# Patient Record
Sex: Male | Born: 1985 | Race: Black or African American | Hispanic: No | Marital: Single | Smoking: Current some day smoker
Health system: Southern US, Community
[De-identification: ages and names within clinical notes are randomized; demographics above are authoritative.]

## PROBLEM LIST (undated history)

## (undated) DIAGNOSIS — J45909 Unspecified asthma, uncomplicated: Secondary | ICD-10-CM

## (undated) DIAGNOSIS — I10 Essential (primary) hypertension: Secondary | ICD-10-CM

---
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
## 2008-05-16 ENCOUNTER — Emergency Department: Payer: Self-pay | Admitting: Emergency Medicine

## 2012-12-28 ENCOUNTER — Emergency Department: Payer: Self-pay | Admitting: Emergency Medicine

## 2016-12-28 ENCOUNTER — Encounter: Payer: Self-pay | Admitting: Emergency Medicine

## 2016-12-28 ENCOUNTER — Emergency Department
Admission: EM | Admit: 2016-12-28 | Discharge: 2016-12-28 | Disposition: A | Discharge status: Home / Self Care | Payer: Self-pay | Attending: Emergency Medicine | Admitting: Emergency Medicine

## 2016-12-28 DIAGNOSIS — F17200 Nicotine dependence, unspecified, uncomplicated: Secondary | ICD-10-CM | POA: Insufficient documentation

## 2016-12-28 DIAGNOSIS — R030 Elevated blood-pressure reading, without diagnosis of hypertension: Secondary | ICD-10-CM | POA: Insufficient documentation

## 2016-12-28 DIAGNOSIS — J301 Allergic rhinitis due to pollen: Secondary | ICD-10-CM | POA: Insufficient documentation

## 2016-12-28 MED ORDER — FLUTICASONE PROPIONATE 50 MCG/ACT NA SUSP: 2.0000 | Freq: Every day | NASAL | 0 refills | Status: DC

## 2016-12-28 MED ORDER — FEXOFENADINE HCL 180 MG PO TABS: 180.0000 mg | ORAL_TABLET | Freq: Every day | ORAL | 0 refills | Status: DC

## 2016-12-28 NOTE — ED Triage Notes (Signed)
Sinus congestion x 3 weeks. Sent home from work to be seen. Needs note he was seen and when he can return to work at discharge.

## 2016-12-28 NOTE — ED Notes (Signed)
NAD noted at time of D/C. Pt denies questions or concerns. Pt ambulatory to the lobby at this time.  

## 2016-12-28 NOTE — Discharge Instructions (Signed)
Follow up with [facility] or your Primary Care Provider for a re-check of your blood pressure.  Begin taking Allegra and flonase for nasal congestion.  Increase fluids.  Decrease smoking.  Tylenol if needed for pain or headache.   May also consider going to clinics such as [facility], [facility], [facility], or [facility].

## 2016-12-28 NOTE — ED Provider Notes (Signed)
[facility] Emergency Department Provider Note   ____________________________________________   First MD Initiated Contact with Patient 12/28/16 0827     (approximate)  I have reviewed the triage vital signs and the nursing notes.   HISTORY  Chief Complaint Facial Pain    HPI Arthur Barnes is a 31 y.o. male presents to emergency room with complaint of sinus congestion for 3 weeks. Patient states that he has had nasal congestion and a slightly productive cough for the last 3 weeks. He is taken over-the-counter medication with some relief. He has not taken any medication today. He states that work sent him here for evaluation. He is unaware of any fever or chills. There's been no nausea or vomiting. He continues to have a mostly nonproductive cough. Patient admits to smoking daily. Blood pressure is also elevated patient denies any history of hypertension for himself but states there is a family history. Currently he rates his discomfort as a 6/10.   History reviewed. No pertinent past medical history.  There are no active problems to display for this patient.   History reviewed. No pertinent surgical history.  Prior to Admission medications   Medication Sig Start Date End Date Taking? Authorizing Provider  fexofenadine (ALLEGRA) 180 MG tablet Take 1 tablet (180 mg total) by mouth daily. 12/28/16   Tommi Rumps, PA-C  fluticasone (FLONASE) 50 MCG/ACT nasal spray Place 2 sprays into both nostrils daily. 12/28/16 12/28/17  Tommi Rumps, PA-C    Allergies Patient has no known allergies.  No family history on file.  Social History Social History  Substance Use Topics  . Smoking status: Current Some Day Smoker  . Smokeless tobacco: Not on file  . Alcohol use Not on file    Review of Systems Constitutional: No fever/chills Eyes: No visual changes. ENT: Positive nasal congestion. Cardiovascular: Denies chest pain. Respiratory: Denies  shortness of breath. Positive cough. Gastrointestinal: No abdominal pain.  No nausea, no vomiting.  No diarrhea. Musculoskeletal: Negative for back pain. Neurological: Negative for headaches, focal weakness or numbness.  10-point ROS otherwise negative.  ____________________________________________   PHYSICAL EXAM:  VITAL SIGNS: ED Triage Vitals  Enc Vitals Group     BP 12/28/16 0730 (!) 146/96     Pulse Rate 12/28/16 0730 100     Resp 12/28/16 0730 20     Temp 12/28/16 0730 97.9 F (36.6 C)     Temp Source 12/28/16 0730 Oral     SpO2 12/28/16 0730 95 %     Weight 12/28/16 0732 220 lb (99.8 kg)     Height 12/28/16 0732 5\' 4"  (1.626 m)     Head Circumference --      Peak Flow --      Pain Score 12/28/16 0729 6     Pain Loc --      Pain Edu? --      Excl. in GC? --     Constitutional: Alert and oriented. Well appearing and in no acute distress.Initially on entering the room patient is sound asleep and snoring. He is easily awakened. Eyes: Conjunctivae are normal. PERRL. EOMI. Head: Atraumatic. Nose: Moderate congestion/rhinnorhea. Turbinates are pale and boggy. EACs and TMs clear bilaterally. Mouth/Throat: Mucous membranes are moist.  Oropharynx non-erythematous. Moderate posterior drainage. Neck: No stridor.   Hematological/Lymphatic/Immunilogical: No cervical lymphadenopathy. Cardiovascular: Normal rate, regular rhythm. Grossly normal heart sounds.  Good peripheral circulation. Respiratory: Normal respiratory effort.  No retractions. Lungs CTAB. Musculoskeletal: No lower extremity tenderness nor  edema.  No joint effusions. Neurologic:  Normal speech and language. No gross focal neurologic deficits are appreciated. No gait instability. Skin:  Skin is warm, dry and intact. No rash noted. Psychiatric: Mood and affect are normal. Speech and behavior are normal.  ____________________________________________   LABS (all labs ordered are listed, but only abnormal results are  displayed)  Labs Reviewed - No data to display  PROCEDURES  Procedure(s) performed: None  Procedures  Critical Care performed: No  ____________________________________________   INITIAL IMPRESSION / ASSESSMENT AND PLAN / ED COURSE  Pertinent labs & imaging results that were available during my care of the patient were reviewed by me and considered in my medical decision making (see chart for details).  Patient's blood pressure was checked second time and still was elevated. He is encouraged to go to his primary care doctor or the doctor that his mother goes to for recheck of his blood pressure. Patient is unsure who his primary doctor is but states his mother knows. Patient was encouraged to discontinue smoking. He was started on Allegra 1 daily for allergies and Flonase nasal spray. He is to increase fluids and discontinue smoking if at all possible.    ____________________________________________   FINAL CLINICAL IMPRESSION(S) / ED DIAGNOSES  Final diagnoses:  Acute seasonal allergic rhinitis due to pollen  Blood pressure elevated without history of HTN      NEW MEDICATIONS STARTED DURING THIS VISIT:  Discharge Medication List as of 12/28/2016  7:58 AM    START taking these medications   Details  fexofenadine (ALLEGRA) 180 MG tablet Take 1 tablet (180 mg total) by mouth daily., Starting Sat 12/28/2016, Print    fluticasone (FLONASE) 50 MCG/ACT nasal spray Place 2 sprays into both nostrils daily., Starting Sat 12/28/2016, Until Sun 12/28/2017, Print         Note:  This document was prepared using Dragon voice recognition software and may include unintentional dictation errors.    Tommi Rumps, PA-C 12/28/16 4497    Nita Sickle, MD 12/30/16 [phone]

## 2017-06-01 ENCOUNTER — Encounter: Payer: Self-pay | Admitting: *Deleted

## 2017-06-01 ENCOUNTER — Emergency Department
Admission: EM | Admit: 2017-06-01 | Discharge: 2017-06-01 | Disposition: A | Discharge status: Home / Self Care | Payer: Self-pay | Attending: Emergency Medicine | Admitting: Emergency Medicine

## 2017-06-01 DIAGNOSIS — M545 Low back pain, unspecified: Secondary | ICD-10-CM

## 2017-06-01 DIAGNOSIS — I10 Essential (primary) hypertension: Secondary | ICD-10-CM | POA: Insufficient documentation

## 2017-06-01 DIAGNOSIS — F17210 Nicotine dependence, cigarettes, uncomplicated: Secondary | ICD-10-CM | POA: Insufficient documentation

## 2017-06-01 HISTORY — DX: Essential (primary) hypertension: I10

## 2017-06-01 MED ORDER — IBUPROFEN 400 MG PO TABS
600.0000 mg | ORAL_TABLET | ORAL | Status: AC
  Administered 2017-06-01: 600 mg via ORAL
  Filled 2017-06-01: qty 2

## 2017-06-01 MED ORDER — DEXAMETHASONE 4 MG PO TABS
6.0000 mg | ORAL_TABLET | Freq: Once | ORAL | Status: AC
  Administered 2017-06-01: 6 mg via ORAL
  Filled 2017-06-01: qty 1.5

## 2017-06-01 MED ORDER — HYDROCODONE-ACETAMINOPHEN 5-325 MG PO TABS: 1.0000 | ORAL_TABLET | Freq: Four times a day (QID) | ORAL | 0 refills | Status: DC | PRN

## 2017-06-01 MED ORDER — IBUPROFEN 600 MG PO TABS
600.0000 mg | ORAL_TABLET | Freq: Four times a day (QID) | ORAL | 0 refills | Status: DC | PRN
Start: 2017-06-01 — End: 2018-08-01

## 2017-06-01 MED ORDER — HYDROCODONE-ACETAMINOPHEN 5-325 MG PO TABS
1.0000 | ORAL_TABLET | Freq: Once | ORAL | Status: AC
Start: 2017-06-01 — End: 2017-06-01
  Administered 2017-06-01: 1 via ORAL
  Filled 2017-06-01: qty 1

## 2017-06-01 NOTE — ED Notes (Signed)
ED Provider at bedside. 

## 2017-06-01 NOTE — ED Provider Notes (Signed)
[facility] Emergency Department Provider Note   ____________________________________________   First MD Initiated Contact with Patient 06/01/17 1723     (approximate)  I have reviewed the triage vital signs and the nursing notes.   HISTORY  Chief Complaint Back Pain    HPI Arthur Barnes is a 31 y.o. male here for evaluation of left back pain  Patient reports for about one week  significant pain in his left lower backstarted while at work. 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 He is been experiencing pain, its just to the left of the spine and worsen when he tries to move or twist or bend. No numbness or tingling. The pain is not moved. No weakness. Denies any ripping or tearing sensations. No chest pain or trouble breathing. No abdominal pain. No trouble urinating.  No incontinence. No numbness anywhere. The pain does not radiate,  Reports he thinks he may have pulled a muscle in his back. He tried taking some ibuprofen for the last few days with little relief. He also reports he's been told he has high blood pressure in the past, that he may need some follow-up on this as well   Past Medical History:  Diagnosis Date  . Hypertension     There are no active problems to display for this patient.   History reviewed. No pertinent surgical history.  Prior to Admission medications   Medication Sig Start Date End Date Taking? Authorizing Provider  HYDROcodone-acetaminophen (NORCO/VICODIN) 5-325 MG tablet Take 1 tablet by mouth every 6 (six) hours as needed for moderate pain. 06/01/17   Sharyn Creamer, MD  ibuprofen (ADVIL,MOTRIN) 600 MG tablet Take 1 tablet (600 mg total) by mouth every 6 (six) hours as needed. 06/01/17   Sharyn Creamer, MD    Allergies Patient has no known allergies.  History reviewed. No pertinent family history.  Social History Social History  Substance Use Topics  . Smoking status:  Current Some Day Smoker  . Smokeless tobacco: Not on file  . Alcohol use Not on file    Review of Systems Constitutional: No fever/chills Eyes: No visual changes. ENT: No sore throat. Cardiovascular: Denies chest pain. Respiratory: Denies shortness of breath. Gastrointestinal: No abdominal pain.  No nausea, no vomiting.   Genitourinary: Negative for dysuria. Musculoskeletal: see history of present illness Skin: Negative for rash. Neurological: Negative for headaches, focal weakness or numbness.    ____________________________________________   PHYSICAL EXAM:  VITAL SIGNS: ED Triage Vitals  Enc Vitals Group     BP 06/01/17 1702 (!) 150/102     Pulse Rate 06/01/17 1702 97     Resp 06/01/17 1702 16     Temp 06/01/17 1702 98.2 F (36.8 C)     Temp Source 06/01/17 1702 Oral     SpO2 06/01/17 1702 95 %     Weight 06/01/17 1701 230 lb (104.3 kg)     Height 06/01/17 1701 5\' 3"  (1.6 m)     Head Circumference --      Peak Flow --      Pain Score 06/01/17 1700 9     Pain Loc --      Pain Edu? --      Excl. in GC? --     Constitutional: Alert and oriented. Well appearing and in no acute distress.he is sitting upright on the bed, very pleasant but obviously experiencing pain in his back when he tries to move  or bend Eyes: Conjunctivae are normal. Head: Atraumatic. Nose: No congestion/rhinnorhea. Mouth/Throat: Mucous membranes are moist. Neck: No stridor.   Cardiovascular: Normal rate, regular rhythm. Grossly normal heart sounds.  Good peripheral circulation. Respiratory: Normal respiratory effort.  No retractions. Lungs CTAB. Gastrointestinal: Soft and nontender. No distention.no abdominal bruit. No palpable mass. He denies any midline pain in the back or abdomen. Musculoskeletal: No lower extremity tenderness nor edema.normal strength in lower extremities bilateral. Normal gait. Dorsalis pedis and posterior tibial pulses are normal in lower extremities bilateral. Normal  sensation in the feet and legs bilaterally. Normal reflexes lower extremity bilateral. No decreased sensation along the posterior buttock Neurologic:  Normal speech and language. No gross focal neurologic deficits are appreciated.  Skin:  Skin is warm, dry and intact. No rash noted. Psychiatric: Mood and affect are normal. Speech and behavior are normal.  ____________________________________________   LABS (all labs ordered are listed, but only abnormal results are displayed)  Labs Reviewed - No data to display ____________________________________________  EKG   ____________________________________________  RADIOLOGY  no back pain and red flags. No indication for emergent imaging ____________________________________________   PROCEDURES  Procedure(s) performed: None  Procedures  Critical Care performed: No  ____________________________________________   INITIAL IMPRESSION / ASSESSMENT AND PLAN / ED COURSE  Pertinent labs & imaging results that were available during my care of the patient were reviewed by me and considered in my medical decision making (see chart for details).  back pain. Appears likely musculoskeletal seated in the left mid to upper lumbar paraspinous region. Symptoms are abated by resting, worsened by movement. Likely associated with lifting and moving at work when it initially happened. Suspect likely of lumbar or musculoskeletal strain involving the mid to lower back. No sciatica or signs or symptoms of cauda equina.  His blood pressure is elevated, but also notes he is in pain. Discussed with him and he will  set up follow-up with the primary for further evaluation. I am hesitant to initiate blood pressure medications today, especially given that he is in pain.  no cardiac or pulmonary symptoms. No vascular symptoms. Denies any abdominal pain. No symptoms to suggest dissection. Reports pain in the same spot for several days worse with bending.  I  will prescribe the patient a narcotic pain medicine due to their condition which I anticipate will cause at least moderate pain short term. I discussed with the patient safe use of narcotic pain medicines, and that they are not to drive, work in dangerous areas, or ever take more than prescribed (no more than 1 pill every 6 hours). We discussed that this is the type of medication that can be  overdosed on and the risks of this type of medicine. Patient is very agreeable to only use as prescribed and to never use more than prescribed.       ____________________________________________   FINAL CLINICAL IMPRESSION(S) / ED DIAGNOSES  Final diagnoses:  Acute left-sided low back pain without sciatica      NEW MEDICATIONS STARTED DURING THIS VISIT:  Discharge Medication List as of 06/01/2017  5:32 PM    START taking these medications   Details  HYDROcodone-acetaminophen (NORCO/VICODIN) 5-325 MG tablet Take 1 tablet by mouth every 6 (six) hours as needed for moderate pain., Starting Sun 06/01/2017, Print    ibuprofen (ADVIL,MOTRIN) 600 MG tablet Take 1 tablet (600 mg total) by mouth every 6 (six) hours as needed., Starting Sun 06/01/2017, Print         Note:  This document was prepared using Dragon voice recognition software and may include unintentional dictation errors.     Sharyn Creamer, MD 06/01/17 1806

## 2017-06-01 NOTE — Discharge Instructions (Signed)
You have been seen in the Emergency Department (ED)  today for back pain.  Your workup and exam have not shown any acute abnormalities and you are likely suffering from muscle strain or possible problems with your discs, but there is no treatment that will fix your symptoms at this time.  Please take Motrin (ibuprofen) as needed for your pain according to the instructions written on the box.  Alternatively, for the next five days you can take 600mg three times daily with meals (it may upset your stomach). ° °Take hydrocodone as prescribed for severe pain. Do not drink alcohol, drive or participate in any other potentially dangerous activities while taking this medication as it may make you sleepy. Do not take this medication with any other sedating medications, either prescription or over-the-counter. If you were prescribed Percocet or Vicodin, do not take these with acetaminophen (Tylenol) as it is already contained within these medications. °  °This medication is an opiate (or narcotic) pain medication and can be habit forming.  Use it as little as possible to achieve adequate pain control.  Do not use or use it with extreme caution if you have a history of opiate abuse or dependence.  If you are on a pain contract with your primary care doctor or a pain specialist, be sure to let them know you were prescribed this medication today from the [facility].  This medication is intended for your use only - do not give any to anyone else and keep it in a secure place where nobody else, especially children, have access to it.  It will also cause or worsen constipation, so you may want to consider taking an over-the-counter stool softener while you are taking this medication. ° °Please follow up with your doctor as soon as possible regarding today's ED visit and your back pain.  Return to the ED for worsening back pain, fever, weakness or numbness of either leg, or if you develop either (1) an  inability to urinate or have bowel movements, or (2) loss of your ability to control your bathroom functions (if you start having "accidents"), or if you develop other new symptoms that concern you. ° °

## 2017-06-01 NOTE — ED Triage Notes (Signed)
States left sided lower back pain for 8 days, denies any injury, denies any urinary symptoms but states pain worsens with movement

## 2018-08-01 ENCOUNTER — Other Ambulatory Visit: Payer: Self-pay

## 2018-08-01 ENCOUNTER — Emergency Department: Payer: Self-pay

## 2018-08-01 ENCOUNTER — Emergency Department
Admission: EM | Admit: 2018-08-01 | Discharge: 2018-08-01 | Disposition: A | Discharge status: Home / Self Care | Payer: Self-pay | Attending: Emergency Medicine | Admitting: Emergency Medicine

## 2018-08-01 ENCOUNTER — Encounter: Payer: Self-pay | Admitting: Emergency Medicine

## 2018-08-01 DIAGNOSIS — I10 Essential (primary) hypertension: Secondary | ICD-10-CM | POA: Insufficient documentation

## 2018-08-01 DIAGNOSIS — F17200 Nicotine dependence, unspecified, uncomplicated: Secondary | ICD-10-CM | POA: Insufficient documentation

## 2018-08-01 DIAGNOSIS — R310 Gross hematuria: Secondary | ICD-10-CM

## 2018-08-01 DIAGNOSIS — R300 Dysuria: Secondary | ICD-10-CM

## 2018-08-01 LAB — URINALYSIS, COMPLETE (UACMP) WITH MICROSCOPIC
BACTERIA UA: NONE SEEN
RBC / HPF: >50 RBC/hpf — ABNORMAL HIGH (ref 0–5)
SQUAMOUS EPITHELIAL / LPF: NONE SEEN (ref 0–5)
Specific Gravity, Urine: 1.012 (ref 1.005–1.030)

## 2018-08-01 LAB — CHLAMYDIA/NGC RT PCR (ARMC ONLY)
CHLAMYDIA TR: NOT DETECTED
N GONORRHOEAE: NOT DETECTED

## 2018-08-01 MED ORDER — NITROFURANTOIN MONOHYD MACRO 100 MG PO CAPS: 100.0000 mg | ORAL_CAPSULE | Freq: Two times a day (BID) | ORAL | 0 refills | Status: DC

## 2018-08-01 NOTE — Discharge Instructions (Signed)
You have been diagnosed with hematuria and dysuria. We are sending off a urine culture. I am given you antibiotics to take 2 x day for the next 7 days. Be sure to drink plenty of water. I want you to follow up with urology if symptoms persist or worsen

## 2018-08-01 NOTE — ED Triage Notes (Signed)
States pain with urination since this am. States last urination noted blood in urine.

## 2018-08-01 NOTE — ED Provider Notes (Signed)
[facility] Emergency Department Provider Note ____________________________________________  Time seen: 1320  I have reviewed the triage vital signs and the nursing notes.  HISTORY  Chief Complaint  Dysuria   HPI Arthur Barnes is a 32 y.o. male presents to the ER today with complaint of dysuria and blood in his urine.  He reports this started this morning.  He denies urgency, frequency, penile discharge or testicular pain and swelling.  He denies fever, chills, nausea or low back pain.  He denies abdominal pain.  He denies history of kidney stones.  He reports he has not been sexually active in the last 6 months.  He denies previous history of urine or prostate problems.  He has not taken anything over-the-counter for symptoms.  Past Medical History:  Diagnosis Date  . Hypertension     There are no active problems to display for this patient.   History reviewed. No pertinent surgical history.  Prior to Admission medications   Medication Sig Start Date End Date Taking? Authorizing Provider  nitrofurantoin, macrocrystal-monohydrate, (MACROBID) 100 MG capsule Take 1 capsule (100 mg total) by mouth 2 (two) times daily. 08/01/18   Lorre Munroe, NP    Allergies Patient has no known allergies.  No family history on file.  Social History Social History   Tobacco Use  . Smoking status: Current Some Day Smoker  Substance Use Topics  . Alcohol use: Not on file  . Drug use: Not on file    Review of Systems  Constitutional: Negative for fever, chills or body aches. Respiratory: Negative for shortness of breath. Gastrointestinal: Negative for abdominal pain.. Genitourinary: Positive for dysuria and blood in urine.  Negative for urgency, frequency, penile discharge or testicular pain and swelling.. Skin: Negative for rash.  ____________________________________________  PHYSICAL EXAM:  VITAL SIGNS: ED Triage Vitals  Enc Vitals Group     BP  08/01/18 1310 (!) 161/101     Pulse Rate 08/01/18 1310 100     Resp 08/01/18 1310 18     Temp 08/01/18 1310 98 F (36.7 C)     Temp Source 08/01/18 1310 Oral     SpO2 08/01/18 1310 96 %     Weight 08/01/18 1311 220 lb (99.8 kg)     Height 08/01/18 1311 5\' 3"  (1.6 m)     Head Circumference --      Peak Flow --      Pain Score 08/01/18 1311 0     Pain Loc --      Pain Edu? --      Excl. in GC? --     Constitutional: Alert and oriented.  Obese, in no distress. Cardiovascular: Normal rate, regular rhythm.  Respiratory: Normal respiratory effort. No wheezes/rales/rhonchi. Gastrointestinal: Soft and nontender. No distention.  No CVA tenderness. GU: Blood noted coming from urethral meatus. No pain or abnormality noted with palpation of penis or scrotum. Skin:  Skin is warm, dry and intact. No rashes noted.  ____________________________________________   LABS  Urinalysis    Component Value Date/Time   COLORURINE RED (A) 08/01/2018 1414   APPEARANCEUR CLOUDY (A) 08/01/2018 1414   LABSPEC 1.012 08/01/2018 1414   PHURINE  08/01/2018 1414    TEST NOT REPORTED DUE TO COLOR INTERFERENCE OF URINE PIGMENT   GLUCOSEU (A) 08/01/2018 1414    TEST NOT REPORTED DUE TO COLOR INTERFERENCE OF URINE PIGMENT   HGBUR (A) 08/01/2018 1414    TEST NOT REPORTED DUE TO COLOR INTERFERENCE OF URINE  PIGMENT   BILIRUBINUR (A) 08/01/2018 1414    TEST NOT REPORTED DUE TO COLOR INTERFERENCE OF URINE PIGMENT   KETONESUR (A) 08/01/2018 1414    TEST NOT REPORTED DUE TO COLOR INTERFERENCE OF URINE PIGMENT   PROTEINUR (A) 08/01/2018 1414    TEST NOT REPORTED DUE TO COLOR INTERFERENCE OF URINE PIGMENT   NITRITE (A) 08/01/2018 1414    TEST NOT REPORTED DUE TO COLOR INTERFERENCE OF URINE PIGMENT   LEUKOCYTESUR (A) 08/01/2018 1414    TEST NOT REPORTED DUE TO COLOR INTERFERENCE OF URINE PIGMENT    ____________________________________________  RADIOLOGY  Imaging Orders     CT Renal Stone Study    IMPRESSION: No acute abnormality noted. ____________________________________________   INITIAL IMPRESSION / ASSESSMENT AND PLAN / ED COURSE  Dysuria, Blood in Urine:  Urinalysis: large blood Will send urine culture Urine gonorrhea/chlamydia pending CT renal stone study negative for acute findings eRx for Macrobid 100 mg BID x 7 days for possible infection Push fluids Follow up with urology if symptoms persist or worsen ____________________________________________  FINAL CLINICAL IMPRESSION(S) / ED DIAGNOSES  Final diagnoses:  Dysuria  Gross hematuria   Nicki Reaper, NP     Lorre Munroe, NP 08/01/18 1509    Myrna Blazer, MD 08/01/18 1520

## 2018-08-01 NOTE — ED Notes (Signed)
Pt reports  painfull urination    With blood in urine  That started this  Am  No back pain  No symptoms of discharge  No sores  Pt reports has not had  Sex  In 6  Months

## 2018-08-01 NOTE — ED Notes (Signed)
Pt  Voided   apporx  150  Cc   Dirty  Urine  Obtained   Amber urine obtained  Pt voided again for mid stream / clean cath  Which was bloody med red  Color  Pain when he  Urinated

## 2018-08-03 LAB — URINE CULTURE: CULTURE: NO GROWTH

## 2018-12-27 ENCOUNTER — Emergency Department: Payer: Self-pay

## 2018-12-27 ENCOUNTER — Other Ambulatory Visit: Payer: Self-pay

## 2018-12-27 ENCOUNTER — Emergency Department
Admission: EM | Admit: 2018-12-27 | Discharge: 2018-12-27 | Disposition: A | Discharge status: Home / Self Care | Payer: Self-pay | Attending: Student in an Organized Health Care Education/Training Program | Admitting: Student in an Organized Health Care Education/Training Program

## 2018-12-27 ENCOUNTER — Encounter: Payer: Self-pay | Admitting: Emergency Medicine

## 2018-12-27 DIAGNOSIS — R059 Cough, unspecified: Secondary | ICD-10-CM

## 2018-12-27 DIAGNOSIS — I10 Essential (primary) hypertension: Secondary | ICD-10-CM | POA: Insufficient documentation

## 2018-12-27 DIAGNOSIS — R05 Cough: Secondary | ICD-10-CM

## 2018-12-27 DIAGNOSIS — F17210 Nicotine dependence, cigarettes, uncomplicated: Secondary | ICD-10-CM | POA: Insufficient documentation

## 2018-12-27 DIAGNOSIS — J40 Bronchitis, not specified as acute or chronic: Secondary | ICD-10-CM

## 2018-12-27 HISTORY — DX: Unspecified asthma, uncomplicated: J45.909

## 2018-12-27 LAB — CBC WITH DIFFERENTIAL/PLATELET
Abs Immature Granulocytes: 0.02 10*3/uL (ref 0.00–0.07)
Basophils Absolute: 0.1 10*3/uL (ref 0.0–0.1)
Basophils Relative: 1 %
Eosinophils Absolute: 0.3 10*3/uL (ref 0.0–0.5)
Eosinophils Relative: 4 %
HCT: 45.1 % (ref 39.0–52.0)
Hemoglobin: 14.8 g/dL (ref 13.0–17.0)
Immature Granulocytes: 0 %
Lymphocytes Relative: 47 %
Lymphs Abs: 3.3 10*3/uL (ref 0.7–4.0)
MCH: 27.9 pg (ref 26.0–34.0)
MCHC: 32.8 g/dL (ref 30.0–36.0)
MCV: 85.1 fL (ref 80.0–100.0)
Monocytes Absolute: 0.7 10*3/uL (ref 0.1–1.0)
Monocytes Relative: 10 %
Neutro Abs: 2.7 10*3/uL (ref 1.7–7.7)
Neutrophils Relative %: 38 %
Platelets: 308 10*3/uL (ref 150–400)
RBC: 5.3 MIL/uL (ref 4.22–5.81)
RDW: 13.7 % (ref 11.5–15.5)
WBC: 7 10*3/uL (ref 4.0–10.5)
nRBC: 0 % (ref 0.0–0.2)

## 2018-12-27 LAB — COMPREHENSIVE METABOLIC PANEL
ALT: 23 U/L (ref 0–44)
AST: 23 U/L (ref 15–41)
Albumin: 3.6 g/dL (ref 3.5–5.0)
Alkaline Phosphatase: 106 U/L (ref 38–126)
Anion gap: 8 (ref 5–15)
BUN: 6 mg/dL (ref 6–20)
CO2: 26 mmol/L (ref 22–32)
Calcium: 8.5 mg/dL — ABNORMAL LOW (ref 8.9–10.3)
Chloride: 103 mmol/L (ref 98–111)
Creatinine, Ser: 0.77 mg/dL (ref 0.61–1.24)
GFR calc Af Amer: >60 mL/min (ref >60)
GFR calc non Af Amer: >60 mL/min (ref >60)
Glucose, Bld: 136 mg/dL — ABNORMAL HIGH (ref 70–99)
Potassium: 3.8 mmol/L (ref 3.5–5.1)
Sodium: 137 mmol/L (ref 135–145)
Total Bilirubin: 0.6 mg/dL (ref 0.3–1.2)
Total Protein: 7.5 g/dL (ref 6.5–8.1)

## 2018-12-27 LAB — BRAIN NATRIURETIC PEPTIDE: B Natriuretic Peptide: 22 pg/mL (ref 0.0–100.0)

## 2018-12-27 LAB — TROPONIN I: Troponin I: <0.03 ng/mL (ref <0.03)

## 2018-12-27 MED ORDER — ALBUTEROL SULFATE HFA 108 (90 BASE) MCG/ACT IN AERS
2.0000 | INHALATION_SPRAY | Freq: Once | RESPIRATORY_TRACT | Status: AC
  Administered 2018-12-27: 2 via RESPIRATORY_TRACT
  Filled 2018-12-27: qty 6.7

## 2018-12-27 MED ORDER — PREDNISONE 20 MG PO TABS: 40.0000 mg | ORAL_TABLET | Freq: Every day | ORAL | 0 refills | Status: AC

## 2018-12-27 NOTE — ED Provider Notes (Signed)
[facility] Emergency Department Provider Note    First MD Initiated Contact with Patient 12/27/18 2010     (approximate)  I have reviewed the triage vital signs and the nursing notes.   HISTORY  Chief Complaint Cough    HPI Arthur Barnes is a 33 y.o. male with a history of asthma and hypertension presents the ER for evaluation of several months of cough and intermittent shortness of breath.  Denies any chest pain or pressure right now.  No measured fevers.  He works at [employer] and was sent to the ER for evaluation due to his cough.  He had one episode of posttussive emesis.  Denies any nausea or vomiting.  Denies any productive cough.  He does still smoke.  Denies any history of heart failure.  Not currently on inhalers or steroids or antihypertensive medication.    Past Medical History:  Diagnosis Date  . Asthma   . Hypertension    No family history on file. History reviewed. No pertinent surgical history. There are no active problems to display for this patient.     Prior to Admission medications   Medication Sig Start Date End Date Taking? Authorizing Provider  nitrofurantoin, macrocrystal-monohydrate, (MACROBID) 100 MG capsule Take 1 capsule (100 mg total) by mouth 2 (two) times daily. 08/01/18   Lorre Munroe, NP  predniSONE (DELTASONE) 20 MG tablet Take 2 tablets (40 mg total) by mouth daily for 5 days. 12/27/18 01/01/19  Willy Eddy, MD    Allergies Patient has no known allergies.    Social History Social History   Tobacco Use  . Smoking status: Current Some Day Smoker  . Smokeless tobacco: Never Used  Substance Use Topics  . Alcohol use: Yes  . Drug use: Never    Review of Systems Patient denies headaches, rhinorrhea, blurry vision, numbness, shortness of breath, chest pain, edema, cough, abdominal pain, nausea, vomiting, diarrhea, dysuria, fevers, rashes or hallucinations unless otherwise stated above in HPI.  ____________________________________________   PHYSICAL EXAM:  VITAL SIGNS: Vitals:   12/27/18 1959 12/27/18 2143  BP: (!) 159/93 (!) 162/87  Pulse: (!) 103 100  Resp: (!) 22 18  Temp: 98.3 F (36.8 C)   SpO2: 99% 100%    Constitutional: Alert and oriented.  Eyes: Conjunctivae are normal.  Head: Atraumatic. Nose: No congestion/rhinnorhea. Mouth/Throat: Mucous membranes are moist.   Neck: No stridor. Painless ROM.  Cardiovascular: Normal rate, regular rhythm. Grossly normal heart sounds.  Good peripheral circulation. Respiratory: Normal respiratory effort.  No retractions. Lungs diminished posteriorly Gastrointestinal: Soft and nontender. No distention. No abdominal bruits. No CVA tenderness. Genitourinary:  Musculoskeletal: No lower extremity tenderness, trace edema.  No joint effusions. Neurologic:  Normal speech and language. No gross focal neurologic deficits are appreciated. No facial droop Skin:  Skin is warm, dry and intact. No rash noted. Psychiatric: Mood and affect are normal. Speech and behavior are normal.  ____________________________________________   LABS (all labs ordered are listed, but only abnormal results are displayed)  Results for orders placed or performed during the hospital encounter of 12/27/18 (from the past 24 hour(s))  CBC with Differential/Platelet     Status: None   Collection Time: 12/27/18  8:51 PM  Result Value Ref Range   WBC 7.0 4.0 - 10.5 K/uL   RBC 5.30 4.22 - 5.81 MIL/uL   Hemoglobin 14.8 13.0 - 17.0 g/dL   HCT 00.7 12.1 - 97.5 %   MCV 85.1 80.0 - 100.0 fL  MCH 27.9 26.0 - 34.0 pg   MCHC 32.8 30.0 - 36.0 g/dL   RDW 45.8 59.2 - 92.4 %   Platelets 308 150 - 400 K/uL   nRBC 0.0 0.0 - 0.2 %   Neutrophils Relative % 38 %   Neutro Abs 2.7 1.7 - 7.7 K/uL   Lymphocytes Relative 47 %   Lymphs Abs 3.3 0.7 - 4.0 K/uL   Monocytes Relative 10 %   Monocytes Absolute 0.7 0.1 - 1.0 K/uL   Eosinophils Relative 4 %   Eosinophils Absolute  0.3 0.0 - 0.5 K/uL   Basophils Relative 1 %   Basophils Absolute 0.1 0.0 - 0.1 K/uL   Immature Granulocytes 0 %   Abs Immature Granulocytes 0.02 0.00 - 0.07 K/uL  Brain natriuretic peptide     Status: None   Collection Time: 12/27/18  8:51 PM  Result Value Ref Range   B Natriuretic Peptide 22.0 0.0 - 100.0 pg/mL  Comprehensive metabolic panel     Status: Abnormal   Collection Time: 12/27/18  9:24 PM  Result Value Ref Range   Sodium 137 135 - 145 mmol/L   Potassium 3.8 3.5 - 5.1 mmol/L   Chloride 103 98 - 111 mmol/L   CO2 26 22 - 32 mmol/L   Glucose, Bld 136 (H) 70 - 99 mg/dL   BUN 6 6 - 20 mg/dL   Creatinine, Ser 4.62 0.61 - 1.24 mg/dL   Calcium 8.5 (L) 8.9 - 10.3 mg/dL   Total Protein 7.5 6.5 - 8.1 g/dL   Albumin 3.6 3.5 - 5.0 g/dL   AST 23 15 - 41 U/L   ALT 23 0 - 44 U/L   Alkaline Phosphatase 106 38 - 126 U/L   Total Bilirubin 0.6 0.3 - 1.2 mg/dL   GFR calc non Af Amer >60 >60 mL/min   GFR calc Af Amer >60 >60 mL/min   Anion gap 8 5 - 15  Troponin I -     Status: None   Collection Time: 12/27/18  9:24 PM  Result Value Ref Range   Troponin I <0.03 <0.03 ng/mL   ____________________________________________  EKG My review and personal interpretation at Time: 20:40   Indication: cough  Rate: 95  Rhythm: sinus Axis: normal Other: normal intervals irbbb,, no stemi ____________________________________________  RADIOLOGY  I personally reviewed all radiographic images ordered to evaluate for the above acute complaints and reviewed radiology reports and findings.  These findings were personally discussed with the patient.  Please see medical record for radiology report.  ____________________________________________   PROCEDURES  Procedure(s) performed:  Procedures    Critical Care performed: no ____________________________________________   INITIAL IMPRESSION / ASSESSMENT AND PLAN / ED COURSE  Pertinent labs & imaging results that were available during my care  of the patient were reviewed by me and considered in my medical decision making (see chart for details).   DDX: asthma, bronchitis, pna, chf, ili, allergies, covid19  Arthur Barnes is a 33 y.o. who presents to the ED with symptoms as described above.  Patient well-appearing no acute distress.  Based on duration of symptoms extensive work-up ordered due to concern for hypertensive urgency his chest x-ray did appear to have enlarged cardiac silhouette.  Blood work is reassuring.  He had improvement in symptoms after albuterol.  He is no infectious symptoms.  Likely bronchitis.  Will get burst of steroids discussed need to follow-up with PCP regarding history of elevated blood pressure persistent cough.  Does not meet  criteria for covid testing per cone algorithm.  Have discussed with the patient and available family all diagnostics and treatments performed thus far and all questions were answered to the best of my ability. The patient demonstrates understanding and agreement with plan.      The patient was evaluated in Emergency Department today for the symptoms described in the history of present illness. He/she was evaluated in the context of the global COVID-19 pandemic, which necessitated consideration that the patient might be at risk for infection with the SARS-CoV-2 virus that causes COVID-19. Institutional protocols and algorithms that pertain to the evaluation of patients at risk for COVID-19 are in a state of rapid change based on information released by regulatory bodies including the CDC and federal and state organizations. These policies and algorithms were followed during the patient's care in the ED.  As part of my medical decision making, I reviewed the following data within the electronic MEDICAL RECORD NUMBER Nursing notes reviewed and incorporated, Labs reviewed, notes from prior ED visits.   ____________________________________________   FINAL CLINICAL IMPRESSION(S) / ED DIAGNOSES   Final diagnoses:  Bronchitis  Cough      NEW MEDICATIONS STARTED DURING THIS VISIT:  New Prescriptions   PREDNISONE (DELTASONE) 20 MG TABLET    Take 2 tablets (40 mg total) by mouth daily for 5 days.     Note:  This document was prepared using Dragon voice recognition software and may include unintentional dictation errors.    Willy Eddyobinson, Contessa Preuss, MD 12/27/18 2216

## 2018-12-27 NOTE — ED Triage Notes (Signed)
Pt arrives POV and ambulatory to room with c/o cough "for a while". Pt has some drainage at this time but is afebrile and in NAD.

## 2018-12-27 NOTE — ED Notes (Signed)
X-ray at bedside

## 2020-05-13 IMAGING — CT CT RENAL STONE PROTOCOL
2 of 4 series · 17 of 46 positions shown, 19 images · non-contrast
Comparison: None.

CLINICAL DATA: Painful urination with hematuria

EXAM:
CT ABDOMEN AND PELVIS WITHOUT CONTRAST
TECHNIQUE: Multidetector CT imaging of the abdomen and pelvis was performed
following the standard protocol without IV contrast.

[Series 2: stone full standard · axial · 0.72mm/px · z∈[-498,-43]mm · 14 of 99 slices shown, 16 images]
[im 4/99  soft-tissue]
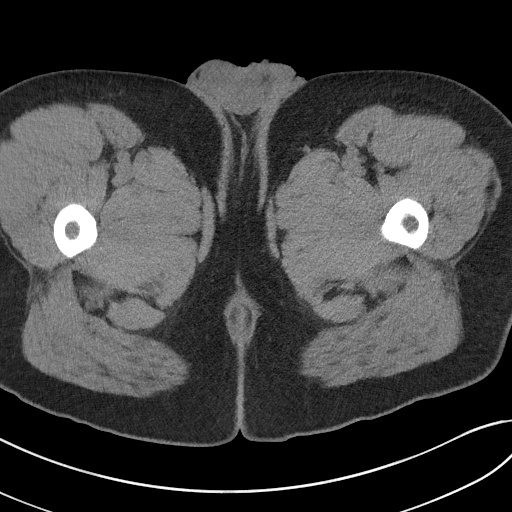
[im 4/99  bone]
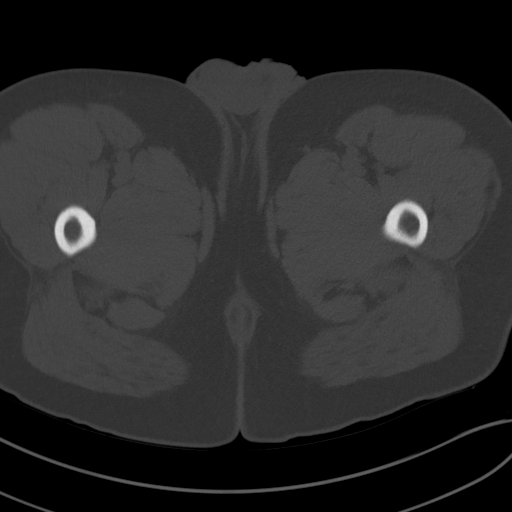
[im 12/99  soft-tissue]
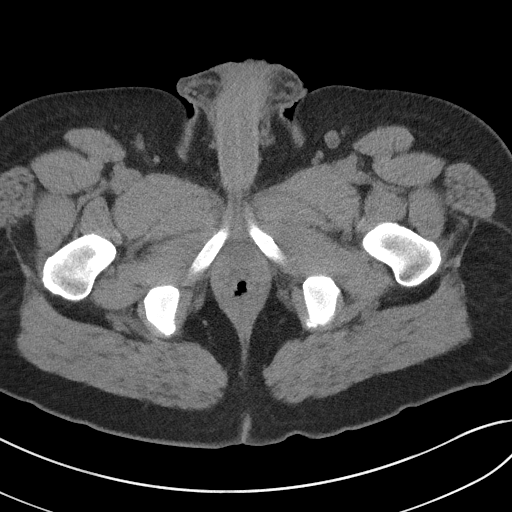
[im 20/99  soft-tissue]
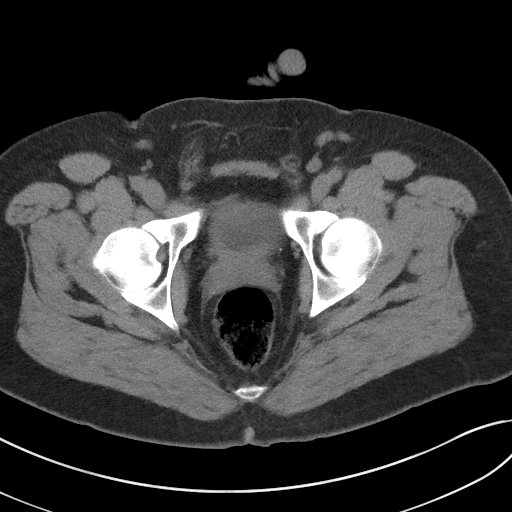
[im 28/99  soft-tissue]
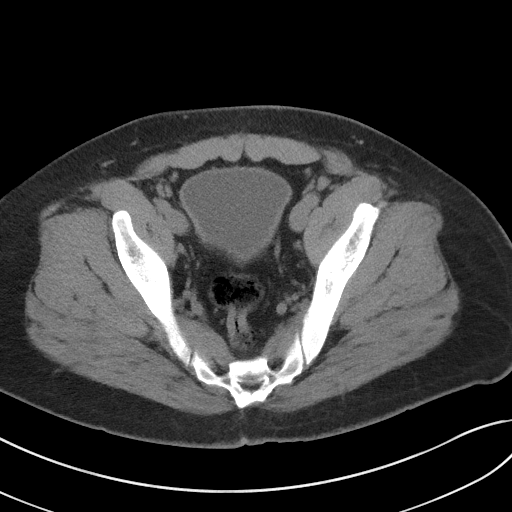
[im 32/99  soft-tissue]
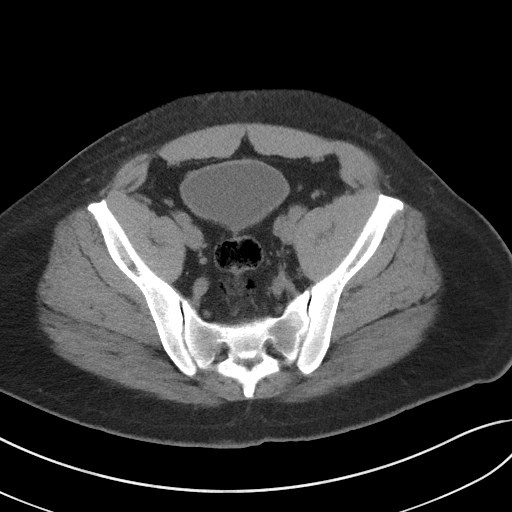
[im 40/99  soft-tissue]
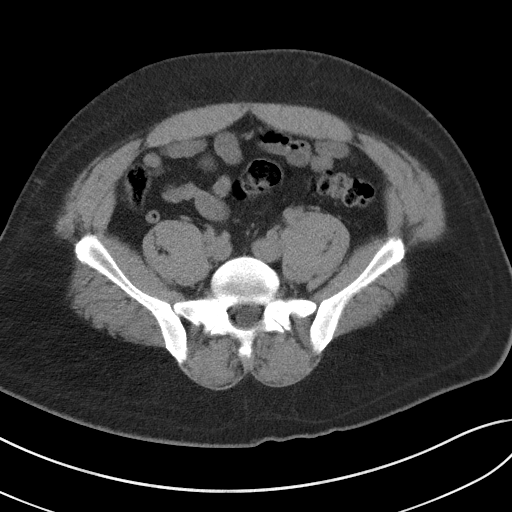
[im 48/99  soft-tissue]
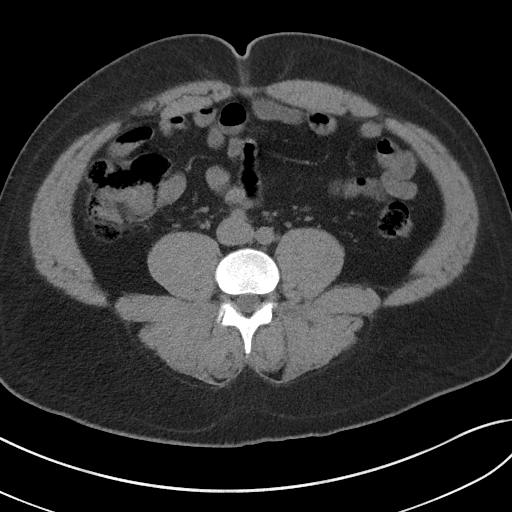
[im 51/99  soft-tissue]
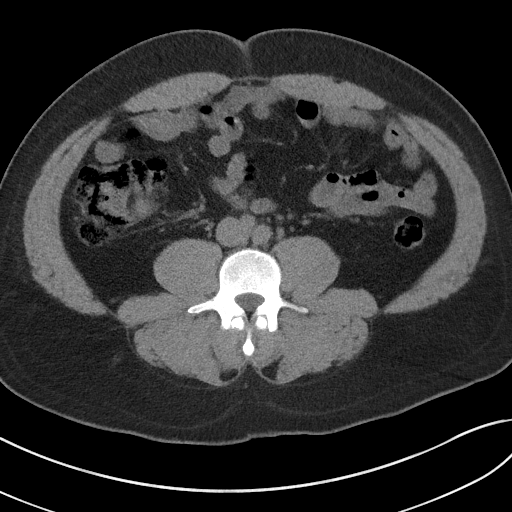
[im 59/99  soft-tissue]
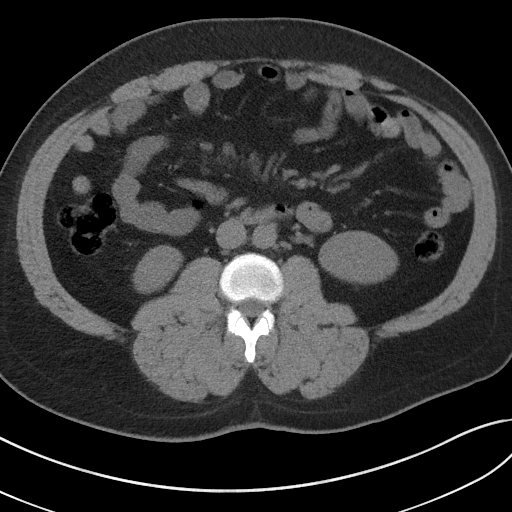
[im 59/99  bone]
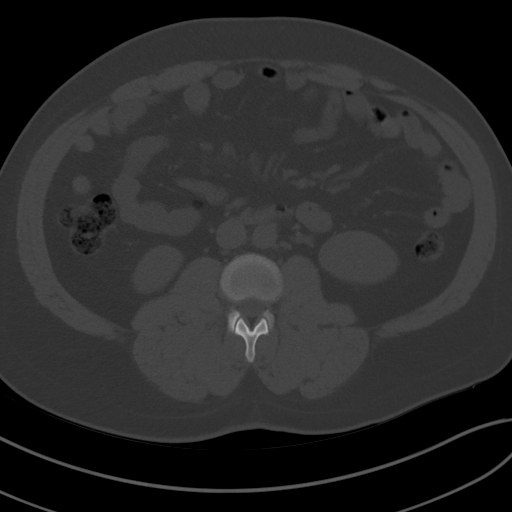
[im 67/99  soft-tissue]
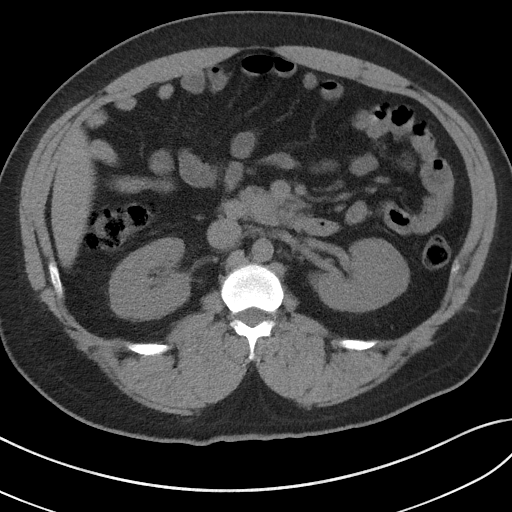
[im 75/99  soft-tissue]
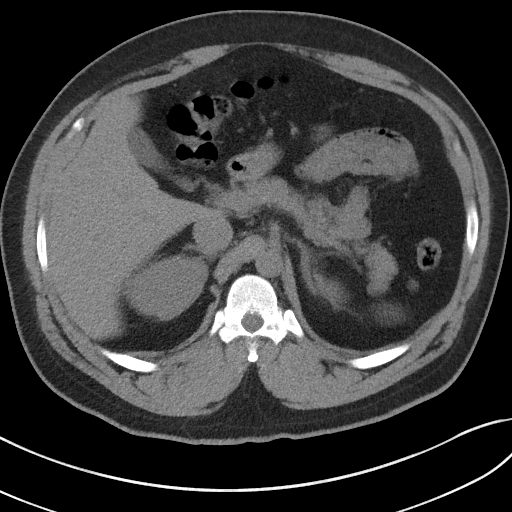
[im 79/99  soft-tissue]
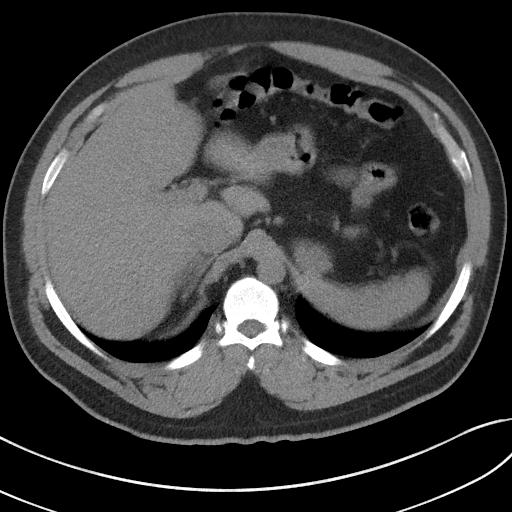
[im 87/99  soft-tissue]
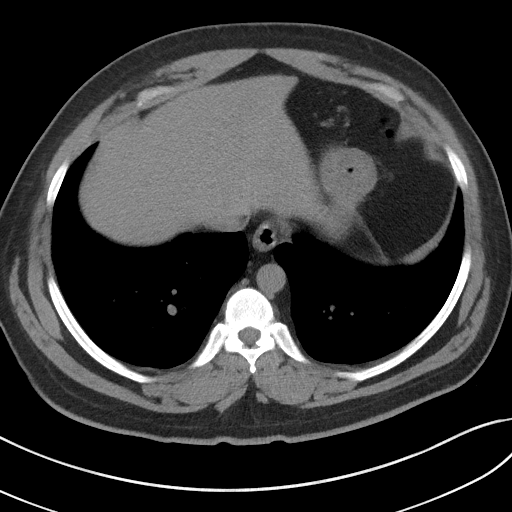
[im 95/99  soft-tissue]
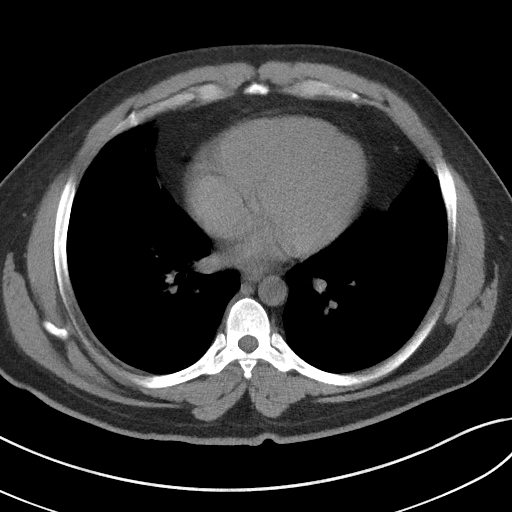

[Series 5: coronal · coronal · 0.85mm/px · 3 of 158 slices shown]
[im 53/158  soft-tissue]
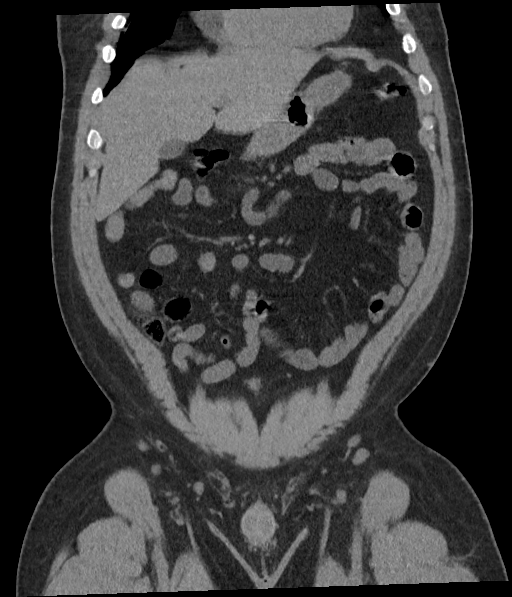
[im 70/158  soft-tissue]
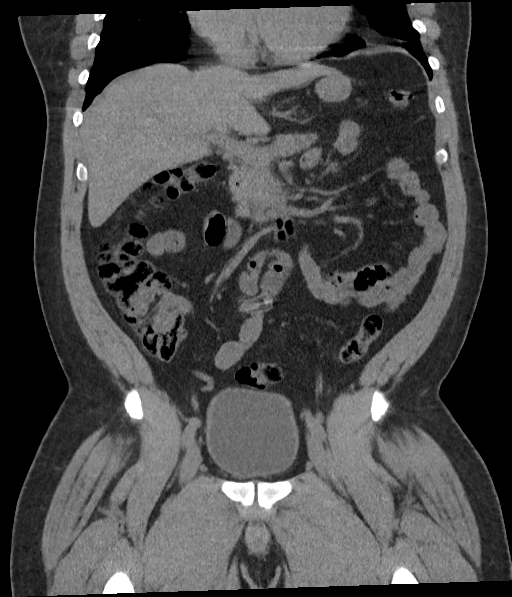
[im 88/158  soft-tissue]
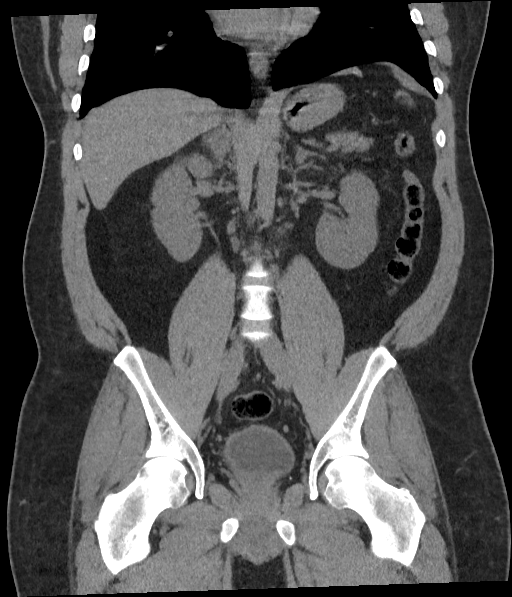

[17 of 46 positions shown; findings below may reference images not displayed]

FINDINGS: Lower chest: No acute abnormality.

Hepatobiliary: No focal liver abnormality is seen. Status post
cholecystectomy. No biliary dilatation.

Pancreas: Unremarkable. No pancreatic ductal dilatation or
surrounding inflammatory changes.

Spleen: Normal in size without focal abnormality.

Adrenals/Urinary Tract: Adrenal glands are within normal limits.
Kidneys demonstrate no evidence of renal calculi or urinary tract
obstructive changes. The bladder is partially distended.

Stomach/Bowel: Stomach is within normal limits. Appendix appears
normal. No evidence of bowel wall thickening, distention, or
inflammatory changes.

Vascular/Lymphatic: No significant vascular findings are present. No
enlarged abdominal or pelvic lymph nodes.

Reproductive: Prostate is unremarkable.

Other: No abdominal wall hernia or abnormality. No abdominopelvic
ascites.

Musculoskeletal: No acute or significant osseous findings.
IMPRESSION: No acute abnormality noted.

## 2021-07-10 ENCOUNTER — Emergency Department
Admission: EM | Admit: 2021-07-10 | Discharge: 2021-07-10 | Disposition: A | Discharge status: Home / Self Care | Payer: 59 | Attending: Emergency Medicine | Admitting: Emergency Medicine

## 2021-07-10 ENCOUNTER — Other Ambulatory Visit: Payer: Self-pay

## 2021-07-10 ENCOUNTER — Emergency Department: Payer: 59

## 2021-07-10 DIAGNOSIS — R778 Other specified abnormalities of plasma proteins: Secondary | ICD-10-CM | POA: Insufficient documentation

## 2021-07-10 DIAGNOSIS — I161 Hypertensive emergency: Secondary | ICD-10-CM | POA: Diagnosis not present

## 2021-07-10 DIAGNOSIS — J189 Pneumonia, unspecified organism: Secondary | ICD-10-CM

## 2021-07-10 DIAGNOSIS — R079 Chest pain, unspecified: Secondary | ICD-10-CM | POA: Diagnosis present

## 2021-07-10 DIAGNOSIS — J40 Bronchitis, not specified as acute or chronic: Secondary | ICD-10-CM | POA: Diagnosis not present

## 2021-07-10 DIAGNOSIS — J45909 Unspecified asthma, uncomplicated: Secondary | ICD-10-CM | POA: Diagnosis not present

## 2021-07-10 DIAGNOSIS — Z20822 Contact with and (suspected) exposure to covid-19: Secondary | ICD-10-CM | POA: Diagnosis not present

## 2021-07-10 DIAGNOSIS — Z789 Other specified health status: Secondary | ICD-10-CM | POA: Diagnosis present

## 2021-07-10 DIAGNOSIS — Z720 Tobacco use: Secondary | ICD-10-CM | POA: Diagnosis present

## 2021-07-10 DIAGNOSIS — R059 Cough, unspecified: Secondary | ICD-10-CM | POA: Diagnosis present

## 2021-07-10 DIAGNOSIS — F17200 Nicotine dependence, unspecified, uncomplicated: Secondary | ICD-10-CM | POA: Diagnosis not present

## 2021-07-10 DIAGNOSIS — I10 Essential (primary) hypertension: Secondary | ICD-10-CM | POA: Diagnosis not present

## 2021-07-10 LAB — CBC
HCT: 42.6 % (ref 39.0–52.0)
Hemoglobin: 14.6 g/dL (ref 13.0–17.0)
MCH: 29.8 pg (ref 26.0–34.0)
MCHC: 34.3 g/dL (ref 30.0–36.0)
MCV: 86.9 fL (ref 80.0–100.0)
Platelets: 274 10*3/uL (ref 150–400)
RBC: 4.9 MIL/uL (ref 4.22–5.81)
RDW: 13.4 % (ref 11.5–15.5)
WBC: 5.7 10*3/uL (ref 4.0–10.5)
nRBC: 0 % (ref 0.0–0.2)

## 2021-07-10 LAB — BASIC METABOLIC PANEL
Anion gap: 7 (ref 5–15)
BUN: 9 mg/dL (ref 6–20)
CO2: 25 mmol/L (ref 22–32)
Calcium: 9 mg/dL (ref 8.9–10.3)
Chloride: 105 mmol/L (ref 98–111)
Creatinine, Ser: 0.9 mg/dL (ref 0.61–1.24)
GFR, Estimated: >60 mL/min (ref >60)
Glucose, Bld: 126 mg/dL — ABNORMAL HIGH (ref 70–99)
Potassium: 3.9 mmol/L (ref 3.5–5.1)
Sodium: 137 mmol/L (ref 135–145)

## 2021-07-10 LAB — RESP PANEL BY RT-PCR (FLU A&B, COVID) ARPGX2
Influenza A by PCR: NEGATIVE
Influenza B by PCR: NEGATIVE
SARS Coronavirus 2 by RT PCR: NEGATIVE

## 2021-07-10 LAB — TROPONIN I (HIGH SENSITIVITY)
Troponin I (High Sensitivity): 47 ng/L — ABNORMAL HIGH (ref <18)
Troponin I (High Sensitivity): 47 ng/L — ABNORMAL HIGH (ref <18)

## 2021-07-10 LAB — D-DIMER, QUANTITATIVE: D-Dimer, Quant: 0.46 ug/mL-FEU (ref 0.00–0.50)

## 2021-07-10 MED ORDER — THIAMINE HCL 100 MG PO TABS
100.0000 mg | ORAL_TABLET | Freq: Every day | ORAL | Status: DC
  Administered 2021-07-10: 100 mg via ORAL
  Filled 2021-07-10: qty 1

## 2021-07-10 MED ORDER — LORAZEPAM 1 MG PO TABS: 0.0000 mg | ORAL_TABLET | Freq: Four times a day (QID) | ORAL | Status: DC

## 2021-07-10 MED ORDER — KETOROLAC TROMETHAMINE 30 MG/ML IJ SOLN
15.0000 mg | Freq: Once | INTRAMUSCULAR | Status: AC
  Administered 2021-07-10: 15 mg via INTRAVENOUS
  Filled 2021-07-10: qty 1

## 2021-07-10 MED ORDER — THIAMINE HCL 100 MG/ML IJ SOLN: 100.0000 mg | Freq: Every day | INTRAMUSCULAR | Status: DC

## 2021-07-10 MED ORDER — LORAZEPAM 2 MG/ML IJ SOLN: 0.0000 mg | Freq: Two times a day (BID) | INTRAMUSCULAR | Status: DC

## 2021-07-10 MED ORDER — NITROGLYCERIN 0.4 MG SL SUBL: 0.4000 mg | SUBLINGUAL_TABLET | SUBLINGUAL | Status: DC | PRN

## 2021-07-10 MED ORDER — ACETAMINOPHEN 500 MG PO TABS
1000.0000 mg | ORAL_TABLET | Freq: Once | ORAL | Status: AC
  Administered 2021-07-10: 1000 mg via ORAL
  Filled 2021-07-10: qty 2

## 2021-07-10 MED ORDER — AMOXICILLIN 500 MG PO CAPS: 1000.0000 mg | ORAL_CAPSULE | Freq: Three times a day (TID) | ORAL | 0 refills | Status: AC

## 2021-07-10 MED ORDER — LORAZEPAM 2 MG/ML IJ SOLN: 0.0000 mg | Freq: Four times a day (QID) | INTRAMUSCULAR | Status: DC

## 2021-07-10 MED ORDER — LABETALOL HCL 5 MG/ML IV SOLN
10.0000 mg | Freq: Once | INTRAVENOUS | Status: DC
  Filled 2021-07-10: qty 4

## 2021-07-10 MED ORDER — LORAZEPAM 1 MG PO TABS: 0.0000 mg | ORAL_TABLET | Freq: Two times a day (BID) | ORAL | Status: DC

## 2021-07-10 MED ORDER — ASPIRIN 81 MG PO CHEW
324.0000 mg | CHEWABLE_TABLET | Freq: Once | ORAL | Status: AC
  Administered 2021-07-10: 324 mg via ORAL
  Filled 2021-07-10: qty 4

## 2021-07-10 NOTE — ED Triage Notes (Signed)
Pt here with CP that started yesterday morning. Pt states that pain is left sided but does not radiate. Pt denies N/V/D but has a runny nose. Pt in NAD in triage.

## 2021-07-10 NOTE — ED Notes (Signed)
This RN currently in another emergency; Zollie Scale RN will assist pt momentarily.

## 2021-07-10 NOTE — ED Notes (Signed)
Confirmed with EDP Katrinka Blazing that ativan ordered as needed for alcohol use as pt had denied history of ETOH use for [employer] but reported to provider Katrinka Blazing that he takes a certain amount of shots of alcohol regularly.

## 2021-07-10 NOTE — ED Provider Notes (Signed)
[facility] Emergency Department Provider Note  ____________________________________________   Event Date/Time   First MD Initiated Contact with Patient 07/10/21 1319     (approximate)  I have reviewed the triage vital signs and the nursing notes.   HISTORY  Chief Complaint Chest Pain   HPI Arthur Barnes is a 35 y.o. male with a past medical history of DM, OSA, asthma and HTN with patient stating his current take insulin but not blood pressure medicines who presents for assessment of cough, congestion and some left-sided chest pain that started yesterday.  Patient denies any tobacco abuse or illicit drug use aside from some intermittent THC use.  He states he typically drinks around 2-3 alcoholic drinks per day although sometimes have a little more but does not drink heavily every day.  He has never had any problems with his heart or lungs before.  Denies any other acute symptoms including fevers, earache, headache, vomiting, diarrhea, burning with urination, Donnell pain, back pain, rash or extremity pain.  No other acute concerns at this time.  Abdominal         Past Medical History:  Diagnosis Date   Asthma    Hypertension     There are no problems to display for this patient.   No past surgical history on file.  Prior to Admission medications   Medication Sig Start Date End Date Taking? Authorizing Provider  nitrofurantoin, macrocrystal-monohydrate, (MACROBID) 100 MG capsule Take 1 capsule (100 mg total) by mouth 2 (two) times daily. 08/01/18   Lorre Munroe, NP    Allergies Patient has no known allergies.  No family history on file.  Social History Social History   Tobacco Use   Smoking status: Some Days   Smokeless tobacco: Never  Substance Use Topics   Alcohol use: Yes   Drug use: Never    Review of Systems  Review of Systems  Constitutional:  Negative for chills and fever.  HENT:  Positive for congestion. Negative for  sore throat.   Eyes:  Negative for pain.  Respiratory:  Positive for cough. Negative for stridor.   Cardiovascular:  Positive for chest pain.  Gastrointestinal:  Negative for vomiting.  Genitourinary:  Negative for dysuria.  Musculoskeletal:  Negative for myalgias.  Skin:  Negative for rash.  Neurological:  Negative for seizures, loss of consciousness and headaches.  Psychiatric/Behavioral:  Negative for suicidal ideas.   All other systems reviewed and are negative.    ____________________________________________   PHYSICAL EXAM:  VITAL SIGNS: ED Triage Vitals  Enc Vitals Group     BP 07/10/21 1209 (!) 184/114     Pulse Rate 07/10/21 1209 85     Resp 07/10/21 1209 18     Temp 07/10/21 1209 97.9 F (36.6 C)     Temp Source 07/10/21 1209 Oral     SpO2 07/10/21 1209 95 %     Weight 07/10/21 1207 220 lb (99.8 kg)     Height 07/10/21 1207 5\' 5"  (1.651 m)     Head Circumference --      Peak Flow --      Pain Score 07/10/21 1207 8     Pain Loc --      Pain Edu? --      Excl. in GC? --    Vitals:   07/10/21 1445 07/10/21 1500  BP:  (!) 168/87  Pulse:  81  Resp: 15   Temp:    SpO2: 93%  Physical Exam Vitals and nursing note reviewed.  Constitutional:      Appearance: He is well-developed. He is obese.  HENT:     Head: Normocephalic and atraumatic.     Right Ear: External ear normal.     Left Ear: External ear normal.     Nose: Nose normal.  Eyes:     Conjunctiva/sclera: Conjunctivae normal.  Cardiovascular:     Rate and Rhythm: Normal rate and regular rhythm.     Heart sounds: No murmur heard. Pulmonary:     Effort: Pulmonary effort is normal. No respiratory distress.     Breath sounds: Normal breath sounds.  Abdominal:     Palpations: Abdomen is soft.     Tenderness: There is no abdominal tenderness.  Musculoskeletal:     Cervical back: Neck supple.  Skin:    General: Skin is warm and dry.  Neurological:     Mental Status: He is alert and oriented to  person, place, and time.  Psychiatric:        Mood and Affect: Mood normal.     ____________________________________________   LABS (all labs ordered are listed, but only abnormal results are displayed)  Labs Reviewed  BASIC METABOLIC PANEL - Abnormal; Notable for the following components:      Result Value   Glucose, Bld 126 (*)    All other components within normal limits  TROPONIN I (HIGH SENSITIVITY) - Abnormal; Notable for the following components:   Troponin I (High Sensitivity) 47 (*)    All other components within normal limits  TROPONIN I (HIGH SENSITIVITY) - Abnormal; Notable for the following components:   Troponin I (High Sensitivity) 47 (*)    All other components within normal limits  RESP PANEL BY RT-PCR (FLU A&B, COVID) ARPGX2  CBC  D-DIMER, QUANTITATIVE   ____________________________________________  EKG  ECG remarkable for sinus rhythm with a ventricular rate of 96, normal axis, right bundle branch block and nonspecific ST changes in V2 and V3 as well as lead III without appearance of acute ischemia or significant arrhythmia. ____________________________________________  RADIOLOGY  ED MD interpretation: Chest x-ray without evidence of pneumothorax, large effusion, overt edema possibly an early infiltrate versus atelectasis at the right base.  No other acute process noted.  Official radiology report(s): DG Chest 2 View  Result Date: 07/10/2021 CLINICAL DATA:  Chest pain EXAM: CHEST - 2 VIEW COMPARISON:  12/27/2018 FINDINGS: Stable mild cardiomegaly. Streaky opacity at the right lung base. No pleural effusion. No pneumothorax. IMPRESSION: Streaky opacity at the right lung base may represent atelectasis or infiltrate. Electronically Signed   By: Duanne Guess D.O.   On: 07/10/2021 13:17    ____________________________________________   PROCEDURES  Procedure(s) performed (including Critical Care):  .1-3 Lead EKG Interpretation Performed by: Gilles Chiquito, MD Authorized by: Gilles Chiquito, MD     Interpretation: non-specific     ECG rate assessment: normal     Rhythm: sinus rhythm     Ectopy: none     Conduction: normal     ____________________________________________   INITIAL IMPRESSION / ASSESSMENT AND PLAN / ED COURSE      Patient presents with above-stated history exam for assessment of some left-sided chest pain associate with cough and congestion.  He denies any other clear associated symptoms.  He is hypertensive with a BP of 184/114 on arrival with otherwise stable vital signs on room air.  Differential includes ACS, myocarditis, pneumonia, bronchitis, pleurisy, PE, metabolic derangements and hypertensive urgency/emergency.  ECG remarkable for sinus rhythm with a ventricular rate of 96, normal axis, right bundle branch block and nonspecific ST changes in V2 and V3 as well as lead III without appearance of acute ischemia or significant arrhythmia.  D-dimer is less than 0.5 and not consistent with PE.  BMP shows no significant electrolyte or metabolic derangements.  CBC shows no leukocytosis or acute anemia.  Initial troponin is elevated at 47.  This concerning for possible ACS versus myocarditis versus some demand ischemia in the setting of hypertensive urgency with initial BP over 180.  Patient given ASA as well as some sublingual nitroglycerin and Tylenol.  On recheck his BP is 168/87 he states he is pain-free.  Will defer heparin at this time pending repeat troponin as patient states he is pain-free.  I will admit to medicine service for further evaluation and management.  Care patient signed over to assuming provider approximately 15-20.  Plan is to admit to medicine service.     ____________________________________________   FINAL CLINICAL IMPRESSION(S) / ED DIAGNOSES  Final diagnoses:  Bronchitis  Troponin I above reference range  Hypertensive emergency    Medications  nitroGLYCERIN (NITROSTAT) SL  tablet 0.4 mg (has no administration in time range)  LORazepam (ATIVAN) injection 0-4 mg (0 mg Intravenous Not Given 07/10/21 1446)    Or  LORazepam (ATIVAN) tablet 0-4 mg ( Oral See Alternative 07/10/21 1446)  LORazepam (ATIVAN) injection 0-4 mg (has no administration in time range)    Or  LORazepam (ATIVAN) tablet 0-4 mg (has no administration in time range)  thiamine tablet 100 mg (100 mg Oral Given 07/10/21 1456)    Or  thiamine (B-1) injection 100 mg ( Intravenous See Alternative 07/10/21 1456)  ketorolac (TORADOL) 30 MG/ML injection 15 mg (has no administration in time range)  aspirin chewable tablet 324 mg (324 mg Oral Given 07/10/21 1422)  acetaminophen (TYLENOL) tablet 1,000 mg (1,000 mg Oral Given 07/10/21 1422)     ED Discharge Orders     None        Note:  This document was prepared using Dragon voice recognition software and may include unintentional dictation errors.    Gilles Chiquito, MD 07/10/21 [phone]

## 2021-07-10 NOTE — ED Provider Notes (Signed)
-----------------------------------------   3:09 PM on 07/10/2021 -----------------------------------------  Blood pressure (!) 168/87, pulse 81, temperature 97.9 F (36.6 C), temperature source Oral, resp. rate 15, height 5\' 5"  (1.651 m), weight 99.8 kg, SpO2 93 %.  Assuming care from Dr. .  In short, Arthur Barnes is a 35 y.o. male with a chief complaint of Chest Pain .  Refer to the original H&P for additional details.  The current plan of care is to follow-up d-dimer and repeat troponin, anticipate admission.  ----------------------------------------- 5:05 PM on 07/10/2021 ----------------------------------------- D-dimer within normal limits and repeat troponin is stable.  Case discussed with Dr. 07/12/2021 of hospitalist service, who request that I speak with cardiology to better determine need for admission.  Case discussed with Dr. Clyde Lundborg of cardiology, who agrees that it would be reasonable to discharge patient home with outpatient follow-up given stable troponin and no ischemic changes on EKG with atypical symptoms.  We will treat patient for pneumonia with amoxicillin as patient noted to have infiltrate on chest x-ray.  He was counseled to follow-up with cardiology and PCP, also counseled to return to the ED for new worsening symptoms.  Patient's blood pressure has been gradually improving and we will hold off on medication for this for now.  Patient agrees with plan.    Okey Dupre, MD 07/10/21 [phone]

## 2021-07-11 ENCOUNTER — Other Ambulatory Visit: Payer: Self-pay

## 2021-07-11 ENCOUNTER — Emergency Department
Admission: EM | Admit: 2021-07-11 | Discharge: 2021-07-11 | Disposition: A | Discharge status: Home / Self Care | Payer: Commercial Managed Care - PPO | Attending: Internal Medicine | Admitting: Internal Medicine

## 2021-07-11 DIAGNOSIS — R079 Chest pain, unspecified: Secondary | ICD-10-CM | POA: Insufficient documentation

## 2021-07-11 DIAGNOSIS — Z5321 Procedure and treatment not carried out due to patient leaving prior to being seen by health care provider: Secondary | ICD-10-CM | POA: Insufficient documentation

## 2021-07-11 LAB — CBC
HCT: 42.5 % (ref 39.0–52.0)
Hemoglobin: 14.8 g/dL (ref 13.0–17.0)
MCH: 30.1 pg (ref 26.0–34.0)
MCHC: 34.8 g/dL (ref 30.0–36.0)
MCV: 86.6 fL (ref 80.0–100.0)
Platelets: 270 10*3/uL (ref 150–400)
RBC: 4.91 MIL/uL (ref 4.22–5.81)
RDW: 13.3 % (ref 11.5–15.5)
WBC: 6.9 10*3/uL (ref 4.0–10.5)
nRBC: 0 % (ref 0.0–0.2)

## 2021-07-11 LAB — BASIC METABOLIC PANEL WITH GFR
Anion gap: 8 (ref 5–15)
BUN: 9 mg/dL (ref 6–20)
CO2: 25 mmol/L (ref 22–32)
Calcium: 8.8 mg/dL — ABNORMAL LOW (ref 8.9–10.3)
Chloride: 104 mmol/L (ref 98–111)
Creatinine, Ser: 0.86 mg/dL (ref 0.61–1.24)
GFR, Estimated: >60 mL/min
Glucose, Bld: 98 mg/dL (ref 70–99)
Potassium: 3.9 mmol/L (ref 3.5–5.1)
Sodium: 137 mmol/L (ref 135–145)

## 2021-07-11 LAB — TROPONIN I (HIGH SENSITIVITY): Troponin I (High Sensitivity): 51 ng/L — ABNORMAL HIGH (ref <18)

## 2021-07-11 NOTE — ED Notes (Signed)
Pt presents to ED with c/o of chest pain. Pt states being seen here yesterday and RX'ed Amxocillin and states its not helping his chest pain.   Pt requests new ABX.

## 2021-07-31 ENCOUNTER — Emergency Department
Admission: EM | Admit: 2021-07-31 | Discharge: 2021-07-31 | Disposition: A | Discharge status: Home / Self Care | Payer: Commercial Managed Care - PPO | Attending: Emergency Medicine | Admitting: Emergency Medicine

## 2021-07-31 ENCOUNTER — Encounter: Payer: Self-pay | Admitting: Emergency Medicine

## 2021-07-31 ENCOUNTER — Other Ambulatory Visit: Payer: Self-pay

## 2021-07-31 DIAGNOSIS — I10 Essential (primary) hypertension: Secondary | ICD-10-CM | POA: Insufficient documentation

## 2021-07-31 DIAGNOSIS — Z23 Encounter for immunization: Secondary | ICD-10-CM | POA: Insufficient documentation

## 2021-07-31 DIAGNOSIS — J45909 Unspecified asthma, uncomplicated: Secondary | ICD-10-CM | POA: Insufficient documentation

## 2021-07-31 DIAGNOSIS — S51811A Laceration without foreign body of right forearm, initial encounter: Secondary | ICD-10-CM | POA: Insufficient documentation

## 2021-07-31 DIAGNOSIS — R000 Tachycardia, unspecified: Secondary | ICD-10-CM | POA: Insufficient documentation

## 2021-07-31 DIAGNOSIS — F17200 Nicotine dependence, unspecified, uncomplicated: Secondary | ICD-10-CM | POA: Insufficient documentation

## 2021-07-31 DIAGNOSIS — S41111A Laceration without foreign body of right upper arm, initial encounter: Secondary | ICD-10-CM

## 2021-07-31 DIAGNOSIS — W268XXA Contact with other sharp object(s), not elsewhere classified, initial encounter: Secondary | ICD-10-CM | POA: Insufficient documentation

## 2021-07-31 DIAGNOSIS — S59911A Unspecified injury of right forearm, initial encounter: Secondary | ICD-10-CM | POA: Diagnosis present

## 2021-07-31 MED ORDER — ACETAMINOPHEN 500 MG PO TABS
1000.0000 mg | ORAL_TABLET | Freq: Once | ORAL | Status: AC
  Administered 2021-07-31: 1000 mg via ORAL
  Filled 2021-07-31: qty 2

## 2021-07-31 MED ORDER — LIDOCAINE HCL (PF) 1 % IJ SOLN
30.0000 mL | Freq: Once | INTRAMUSCULAR | Status: AC
  Administered 2021-07-31: 30 mL
  Filled 2021-07-31: qty 30

## 2021-07-31 MED ORDER — LIDOCAINE HCL (PF) 1 % IJ SOLN
INTRAMUSCULAR | Status: AC
  Filled 2021-07-31: qty 5

## 2021-07-31 MED ORDER — TETANUS-DIPHTH-ACELL PERTUSSIS 5-2.5-18.5 LF-MCG/0.5 IM SUSY
0.5000 mL | PREFILLED_SYRINGE | Freq: Once | INTRAMUSCULAR | Status: AC
  Administered 2021-07-31: 0.5 mL via INTRAMUSCULAR
  Filled 2021-07-31: qty 0.5

## 2021-07-31 NOTE — ED Notes (Signed)
See triage note  presents with laceration to right forearm  bleeding controlled at present

## 2021-07-31 NOTE — ED Triage Notes (Signed)
Patient ambulatory to triage with steady gait, without difficulty or distress noted; pt reports he was up getting ready for work & fell on the toilet lid, it broke and has approx 2" lac to rt FA; no active bleeding; denies any other injuries or c/o; saline soaked 4x4 dressing applied

## 2021-07-31 NOTE — ED Provider Notes (Signed)
[facility] Emergency Department Provider Note  ____________________________________________   Event Date/Time   First MD Initiated Contact with Patient 07/31/21 [phone]     (approximate)  I have reviewed the triage vital signs and the nursing notes.   HISTORY  Chief Complaint Laceration   HPI Arthur Barnes is a 35 y.o. male with a past medical history of asthma hypertension who presents for assessment of a laceration he sustained to his right mid forearm when he tripped carrying the top of the toilet that was porcelain porcelain broke cutting right forearm.  He denies any other injuries.  Does not think he hit his head or had LOC.  No back pain, neck pain, chest pain, abdominal pain or any other cuts or extremity pain.  He is not sure when his last tetanus shot was.  He has otherwise been in his usual state health without any recent fevers, chills, cough, nausea, vomiting, diarrhea, burning with urination, rash or other recent injuries or falls.         Past Medical History:  Diagnosis Date   Asthma    Hypertension     Patient Active Problem List   Diagnosis Date Noted   Hypertension    Asthma    Chest pain    Elevated troponin    Tobacco abuse    Alcohol use     History reviewed. No pertinent surgical history.  Prior to Admission medications   Medication Sig Start Date End Date Taking? Authorizing Provider  nitrofurantoin, macrocrystal-monohydrate, (MACROBID) 100 MG capsule Take 1 capsule (100 mg total) by mouth 2 (two) times daily. Patient not taking: Reported on 07/10/2021 08/01/18   Lorre Munroe, NP    Allergies Patient has no known allergies.  No family history on file.  Social History Social History   Tobacco Use   Smoking status: Some Days   Smokeless tobacco: Never  Vaping Use   Vaping Use: Never used  Substance Use Topics   Alcohol use: Yes   Drug use: Never    Review of Systems  Review of Systems   Constitutional:  Negative for chills and fever.  HENT:  Negative for sore throat.   Eyes:  Negative for pain.  Respiratory:  Negative for cough and stridor.   Cardiovascular:  Negative for chest pain.  Gastrointestinal:  Negative for vomiting.  Genitourinary:  Negative for dysuria.  Musculoskeletal:  Positive for myalgias (R forearm).  Skin:  Negative for rash.  Neurological:  Negative for seizures and loss of consciousness.  Psychiatric/Behavioral:  Negative for suicidal ideas.   All other systems reviewed and are negative.    ____________________________________________   PHYSICAL EXAM:  VITAL SIGNS: ED Triage Vitals  Enc Vitals Group     BP 07/31/21 0530 (!) 157/100     Pulse Rate 07/31/21 0530 (!) 105     Resp 07/31/21 0530 18     Temp 07/31/21 0530 98.3 F (36.8 C)     Temp Source 07/31/21 0530 Oral     SpO2 07/31/21 0530 97 %     Weight 07/31/21 0535 220 lb (99.8 kg)     Height 07/31/21 0535 5\' 6"  (1.676 m)     Head Circumference --      Peak Flow --      Pain Score 07/31/21 0534 9     Pain Loc --      Pain Edu? --      Excl. in GC? --    Vitals:  07/31/21 0530  BP: (!) 157/100  Pulse: (!) 105  Resp: 18  Temp: 98.3 F (36.8 C)  SpO2: 97%   Physical Exam Vitals and nursing note reviewed.  Constitutional:      Appearance: He is well-developed. He is obese.  HENT:     Head: Normocephalic and atraumatic.     Right Ear: External ear normal.     Left Ear: External ear normal.     Nose: Nose normal.  Eyes:     Conjunctiva/sclera: Conjunctivae normal.  Cardiovascular:     Rate and Rhythm: Regular rhythm. Tachycardia present.     Heart sounds: No murmur heard. Pulmonary:     Effort: Pulmonary effort is normal. No respiratory distress.     Breath sounds: Normal breath sounds.  Abdominal:     Palpations: Abdomen is soft.     Tenderness: There is no abdominal tenderness.  Musculoskeletal:     Cervical back: Neck supple.  Skin:    General: Skin is  warm and dry.  Neurological:     Mental Status: He is alert and oriented to person, place, and time.    Cranial nerves II through XII are grossly intact.  There is no tenderness step-offs or deformities over the C/T/L-spine.  Patient has full and symmetric strength of his bilateral upper extremities.  He has full grip strength in the right upper extremity is able to flex and extend all digits against resistance.  Sensation is intact in the distribution of the radial ulnar and median nerves.  2+ radial pulse.  Is approximately 3 cm linear hemostatic laceration over the posterior right mid forearm.  No other evidence of trauma to the hand or forearm. ____________________________________________   LABS (all labs ordered are listed, but only abnormal results are displayed)  Labs Reviewed - No data to display ____________________________________________  EKG  ____________________________________________  RADIOLOGY  ED MD interpretation:    Official radiology report(s): No results found.  ____________________________________________   PROCEDURES  Procedure(s) performed (including Critical Care):  Marland KitchenMarland KitchenLaceration Repair  Date/Time: 07/31/2021 8:02 AM Performed by: Gilles Chiquito, MD Authorized by: Gilles Chiquito, MD   Consent:    Consent obtained:  Verbal   Consent given by:  Patient   Risks discussed:  Pain, infection and need for additional repair Universal protocol:    Procedure explained and questions answered to patient or proxy's satisfaction: yes     Patient identity confirmed:  Verbally with patient Laceration details:    Location:  Shoulder/arm   Shoulder/arm location:  R lower arm   Length (cm):  3 Exploration:    Imaging outcome: foreign body not noted     Wound exploration: wound explored through full range of motion     Contaminated: no   Treatment:    Area cleansed with:  Saline   Amount of cleaning:  Extensive   Irrigation solution:  Sterile  saline   Irrigation method:  Syringe   Visualized foreign bodies/material removed: no     Scar revision: no   Skin repair:    Repair method:  Sutures   Suture size:  5-0   Suture material:  Prolene   Suture technique:  Simple interrupted   Number of sutures:  4 Approximation:    Approximation:  Close Repair type:    Repair type:  Simple Post-procedure details:    Procedure completion:  Tolerated well, no immediate complications   ____________________________________________   INITIAL IMPRESSION / ASSESSMENT AND PLAN / ED COURSE  Patient presents with above-stated history exam for assessment of laceration to the right forearm he sustained when a porcelain piece of a toilet he was carrying broke when he tripped.  He has a small laceration on the right forearm but otherwise no evidence of injuries and is otherwise neurovascular intact.  Wound was cleaned and tetanus updated.  Laceration repaired per procedure note above.  Patient informed Writing of high blood pressure and recommendation of this rechecked in a couple days.  Standard laceration and suture instructions discussed.  Discharged in stable condition.        ____________________________________________   FINAL CLINICAL IMPRESSION(S) / ED DIAGNOSES  Final diagnoses:  Laceration of right upper extremity, initial encounter  Hypertension, unspecified type    Medications  lidocaine (PF) (XYLOCAINE) 1 % injection (has no administration in time range)  lidocaine (PF) (XYLOCAINE) 1 % injection 30 mL (30 mLs Infiltration Given 07/31/21 0730)  acetaminophen (TYLENOL) tablet 1,000 mg (1,000 mg Oral Given 07/31/21 0729)  Tdap (BOOSTRIX) injection 0.5 mL (0.5 mLs Intramuscular Given 07/31/21 0730)     ED Discharge Orders     None        Note:  This document was prepared using Dragon voice recognition software and may include unintentional dictation errors.    Gilles Chiquito, MD 07/31/21 [phone]

## 2021-08-08 ENCOUNTER — Other Ambulatory Visit: Payer: Self-pay

## 2021-08-08 ENCOUNTER — Encounter: Payer: Self-pay | Admitting: Emergency Medicine

## 2021-08-08 ENCOUNTER — Emergency Department
Admission: EM | Admit: 2021-08-08 | Discharge: 2021-08-08 | Disposition: A | Discharge status: Home / Self Care | Payer: Self-pay | Attending: Emergency Medicine | Admitting: Emergency Medicine

## 2021-08-08 DIAGNOSIS — R030 Elevated blood-pressure reading, without diagnosis of hypertension: Secondary | ICD-10-CM | POA: Insufficient documentation

## 2021-08-08 DIAGNOSIS — Z4802 Encounter for removal of sutures: Secondary | ICD-10-CM | POA: Insufficient documentation

## 2021-08-08 DIAGNOSIS — J45909 Unspecified asthma, uncomplicated: Secondary | ICD-10-CM | POA: Insufficient documentation

## 2021-08-08 DIAGNOSIS — F17200 Nicotine dependence, unspecified, uncomplicated: Secondary | ICD-10-CM | POA: Insufficient documentation

## 2021-08-08 DIAGNOSIS — I10 Essential (primary) hypertension: Secondary | ICD-10-CM | POA: Insufficient documentation

## 2021-08-08 NOTE — ED Triage Notes (Signed)
Pt here for suture removal right forearm. Pt denies concern for infection or pain

## 2021-08-08 NOTE — Discharge Instructions (Signed)
Keep area clean and dry.  Watch for any signs of infection.  You may clean daily with mild soap and water and allowed to air dry completely.  Also your blood pressure was noted to be elevated while in the emergency department initially in triage at 182/108.  This was down considerably prior to your discharge.  Because of your mother's history of hypertension you will need to have your blood pressure rechecked to evaluate for hypertension and if continued elevation to be treated.

## 2021-08-08 NOTE — ED Notes (Signed)
Sutures removed from right forearm.  Benzoin and 3 steri-strips applied.  Wound care reviewed with patient for discharge needs.

## 2021-08-08 NOTE — ED Provider Notes (Signed)
[facility] Emergency Department Provider Note  ____________________________________________   Event Date/Time   First MD Initiated Contact with Patient 08/08/21 [phone]     (approximate)  I have reviewed the triage vital signs and the nursing notes.   HISTORY  Chief Complaint Suture / Staple Removal   HPI Arthur Barnes is a 35 y.o. male presents to the ED for suture removal.  Patient was told to return on 08/05/2021 for suture removal.  Patient denies any drainage, fever or concerns for infection.  Blood pressure is also elevated.  Patient states he has never been diagnosed with hypertension and does not take medication.  He does have family history of hypertension.  Currently he denies any headache, chest pain, shortness of breath.         Past Medical History:  Diagnosis Date   Asthma    Hypertension     Patient Active Problem List   Diagnosis Date Noted   Hypertension    Asthma    Chest pain    Elevated troponin    Tobacco abuse    Alcohol use     History reviewed. No pertinent surgical history.  Prior to Admission medications   Not on File    Allergies Patient has no known allergies.  No family history on file.  Social History Social History   Tobacco Use   Smoking status: Some Days   Smokeless tobacco: Never  Vaping Use   Vaping Use: Never used  Substance Use Topics   Alcohol use: Yes   Drug use: Never    Review of Systems Constitutional: No fever/chills Cardiovascular: Denies chest pain. Respiratory: Denies shortness of breath. Gastrointestinal:  No nausea, no vomiting.  Skin: Sutured right forearm. Neurological: Negative for headaches, focal weakness or numbness. ____________________________________________   PHYSICAL EXAM:  VITAL SIGNS: ED Triage Vitals  Enc Vitals Group     BP 08/08/21 0831 (!) 182/108     Pulse Rate 08/08/21 0831 (!) 108     Resp 08/08/21 0831 20     Temp 08/08/21 0831 (!) 97.5 F  (36.4 C)     Temp Source 08/08/21 0831 Oral     SpO2 08/08/21 0831 95 %     Weight 08/08/21 0828 218 lb 4.1 oz (99 kg)     Height 08/08/21 0828 5\' 6"  (1.676 m)     Head Circumference --      Peak Flow --      Pain Score 08/08/21 0828 0     Pain Loc --      Pain Edu? --      Excl. in GC? --     Constitutional: Alert and oriented. Well appearing and in no acute distress. Eyes: Conjunctivae are normal.  Head: Atraumatic. Neck: No stridor.   Cardiovascular: Normal rate, regular rhythm. Grossly normal heart sounds.  Good peripheral circulation. Respiratory: Normal respiratory effort.  No retractions. Lungs CTAB. Musculoskeletal: Moves upper and lower extremities that any difficulty. Neurologic:  Normal speech and language. No gross focal neurologic deficits are appreciated. No gait instability. Skin:  Skin is warm, dry.  Laceration site right forearm without any signs of infection. Psychiatric: Mood and affect are normal. Speech and behavior are normal.  ____________________________________________   LABS (all labs ordered are listed, but only abnormal results are displayed)  Labs Reviewed - No data to display ____________________________________________  PROCEDURES  Procedure(s) performed (including Critical Care):  Procedures   ____________________________________________   INITIAL IMPRESSION / ASSESSMENT AND PLAN /  ED COURSE  As part of my medical decision making, I reviewed the following data within the electronic MEDICAL RECORD NUMBER Notes from prior ED visits and [place] Controlled Substance Database  35 year old male presents to the ED for suture removal.  Patient was seen in the ED 8 days ago which time he had a laceration to his right forearm.  He denies any drainage or signs of infection.  It was noted that his blood pressure was elevated without history of hypertension.  There is family history however and patient was told to follow-up with his PCP for recheck of his blood  pressure.  ____________________________________________   FINAL CLINICAL IMPRESSION(S) / ED DIAGNOSES  Final diagnoses:  Encounter for removal of sutures  Elevated blood pressure reading     ED Discharge Orders     None        Note:  This document was prepared using Dragon voice recognition software and may include unintentional dictation errors.    Tommi Rumps, PA-C 08/08/21 1143    Gilles Chiquito, MD 08/08/21 [phone]

## 2021-08-08 NOTE — ED Notes (Signed)
No discharge e-signature obtained due to signature pad being unavailable at this time.  Pt verbalized understanding of all discharge instructions, f/u care, and education regarding wound care.

## 2021-08-17 ENCOUNTER — Ambulatory Visit: Payer: Self-pay | Admitting: Internal Medicine

## 2021-08-17 NOTE — Progress Notes (Deleted)
   New Outpatient Visit Date: 08/17/2021  Referring Provider: Chesley Noon, MD [facility] Emergency Department  Chief Complaint: ***  HPI:  Mr. Nuttall is a 35 y.o. male who is being seen today for the evaluation of chest pain and elevated troponin at the request of Dr. Larinda Buttery. He has a history of hypertension, type 2 diabetes mellitus, obstructive sleep apnea, and asthma.  He presented to [facility] in late October complaining of cough, congestion, and left-sided chest pain.  Chest radiograph showed streaky opacity at the right lung base that may represent atelectasis or infiltrate.  D-dimer was negative.  High-sensitivity troponin I was mildly elevated but flat (47->47->51), thought to represent demand ischemia in the setting of possible pneumonia and uncontrolled hypertension.  EKG showed sinus rhythm with right bundle branch block.  No acute ischemic changes were identified.  He was discharged with a prescription for amoxicillin and advised to follow-up with [place] for further cardiac evaluation.***  --------------------------------------------------------------------------------------------------  Cardiovascular History & Procedures: Cardiovascular Problems: Chest pain  Risk Factors: Hypertension, type 2 diabetes mellitus, obesity, tobacco use, and male gender  Cath/PCI: None  CV Surgery: None  EP Procedures and Devices: None  Non-Invasive Evaluation(s): None  Recent CV Pertinent Labs: Lab Results  Component Value Date   BNP 22.0 12/27/2018   K 3.9 07/11/2021   BUN 9 07/11/2021   CREATININE 0.86 07/11/2021    --------------------------------------------------------------------------------------------------  Past Medical History:  Diagnosis Date   Asthma    Hypertension     No past surgical history on file.  No outpatient medications have been marked as taking for the 08/17/21 encounter (Appointment) with Tamela Elsayed, Cristal Deer, MD.    Allergies: Patient has no known  allergies.  Social History   Tobacco Use   Smoking status: Some Days   Smokeless tobacco: Never  Vaping Use   Vaping Use: Never used  Substance Use Topics   Alcohol use: Yes   Drug use: Never    No family history on file.  Review of Systems: A 12-system review of systems was performed and was negative except as noted in the HPI.  --------------------------------------------------------------------------------------------------  Physical Exam: There were no vitals taken for this visit.  General:  *** HEENT: No conjunctival pallor or scleral icterus. Facemask in place. Neck: Supple without lymphadenopathy, thyromegaly, JVD, or HJR. No carotid bruit. Lungs: Normal work of breathing. Clear to auscultation bilaterally without wheezes or crackles. Heart: Regular rate and rhythm without murmurs, rubs, or gallops. Non-displaced PMI. Abd: Bowel sounds present. Soft, NT/ND without hepatosplenomegaly Ext: No lower extremity edema. Radial, PT, and DP pulses are 2+ bilaterally Skin: Warm and dry without rash. Neuro: CNIII-XII intact. Strength and fine-touch sensation intact in upper and lower extremities bilaterally. Psych: Normal mood and affect.  EKG:  ***  Lab Results  Component Value Date   WBC 6.9 07/11/2021   HGB 14.8 07/11/2021   HCT 42.5 07/11/2021   MCV 86.6 07/11/2021   PLT 270 07/11/2021    Lab Results  Component Value Date   NA 137 07/11/2021   K 3.9 07/11/2021   CL 104 07/11/2021   CO2 25 07/11/2021   BUN 9 07/11/2021   CREATININE 0.86 07/11/2021   GLUCOSE 98 07/11/2021   ALT 23 12/27/2018    No results found for: CHOL, HDL, LDLCALC, LDLDIRECT, TRIG, CHOLHDL   --------------------------------------------------------------------------------------------------  ASSESSMENT AND PLAN: ***  Yvonne Kendall, MD 08/17/2021 7:23 AM

## 2021-08-20 ENCOUNTER — Encounter: Payer: Self-pay | Admitting: Internal Medicine

## 2022-06-24 ENCOUNTER — Inpatient Hospital Stay: Admit: 2022-06-24 | Payer: Commercial Managed Care - HMO

## 2022-06-24 ENCOUNTER — Emergency Department: Payer: Commercial Managed Care - HMO

## 2022-06-24 ENCOUNTER — Other Ambulatory Visit: Payer: Self-pay

## 2022-06-24 ENCOUNTER — Emergency Department: Admit: 2022-06-24 | Payer: Commercial Managed Care - HMO

## 2022-06-24 ENCOUNTER — Inpatient Hospital Stay
Admission: EM | Admit: 2022-06-24 | Discharge: 2022-06-30 | DRG: 280 | Disposition: A | Discharge status: Home / Self Care | Payer: Commercial Managed Care - HMO | Attending: Internal Medicine | Admitting: Internal Medicine

## 2022-06-24 DIAGNOSIS — E0590 Thyrotoxicosis, unspecified without thyrotoxic crisis or storm: Secondary | ICD-10-CM | POA: Diagnosis present

## 2022-06-24 DIAGNOSIS — J810 Acute pulmonary edema: Secondary | ICD-10-CM

## 2022-06-24 DIAGNOSIS — J9601 Acute respiratory failure with hypoxia: Secondary | ICD-10-CM | POA: Diagnosis present

## 2022-06-24 DIAGNOSIS — Z789 Other specified health status: Secondary | ICD-10-CM | POA: Diagnosis not present

## 2022-06-24 DIAGNOSIS — I429 Cardiomyopathy, unspecified: Secondary | ICD-10-CM | POA: Diagnosis not present

## 2022-06-24 DIAGNOSIS — G4733 Obstructive sleep apnea (adult) (pediatric): Secondary | ICD-10-CM | POA: Diagnosis present

## 2022-06-24 DIAGNOSIS — J45909 Unspecified asthma, uncomplicated: Secondary | ICD-10-CM | POA: Diagnosis present

## 2022-06-24 DIAGNOSIS — E669 Obesity, unspecified: Secondary | ICD-10-CM | POA: Diagnosis present

## 2022-06-24 DIAGNOSIS — I110 Hypertensive heart disease with heart failure: Secondary | ICD-10-CM | POA: Diagnosis present

## 2022-06-24 DIAGNOSIS — I5021 Acute systolic (congestive) heart failure: Secondary | ICD-10-CM | POA: Diagnosis not present

## 2022-06-24 DIAGNOSIS — I214 Non-ST elevation (NSTEMI) myocardial infarction: Secondary | ICD-10-CM | POA: Diagnosis present

## 2022-06-24 DIAGNOSIS — I509 Heart failure, unspecified: Secondary | ICD-10-CM

## 2022-06-24 DIAGNOSIS — F1010 Alcohol abuse, uncomplicated: Secondary | ICD-10-CM | POA: Diagnosis present

## 2022-06-24 DIAGNOSIS — R778 Other specified abnormalities of plasma proteins: Secondary | ICD-10-CM | POA: Diagnosis not present

## 2022-06-24 DIAGNOSIS — I5082 Biventricular heart failure: Secondary | ICD-10-CM | POA: Diagnosis present

## 2022-06-24 DIAGNOSIS — I441 Atrioventricular block, second degree: Secondary | ICD-10-CM | POA: Diagnosis present

## 2022-06-24 DIAGNOSIS — I4719 Other supraventricular tachycardia: Secondary | ICD-10-CM | POA: Diagnosis present

## 2022-06-24 DIAGNOSIS — F17210 Nicotine dependence, cigarettes, uncomplicated: Secondary | ICD-10-CM | POA: Diagnosis not present

## 2022-06-24 DIAGNOSIS — Z6841 Body Mass Index (BMI) 40.0 and over, adult: Secondary | ICD-10-CM

## 2022-06-24 DIAGNOSIS — Z1152 Encounter for screening for COVID-19: Secondary | ICD-10-CM | POA: Diagnosis not present

## 2022-06-24 DIAGNOSIS — E0591 Thyrotoxicosis, unspecified with thyrotoxic crisis or storm: Secondary | ICD-10-CM | POA: Diagnosis not present

## 2022-06-24 DIAGNOSIS — Z8249 Family history of ischemic heart disease and other diseases of the circulatory system: Secondary | ICD-10-CM | POA: Diagnosis not present

## 2022-06-24 DIAGNOSIS — I428 Other cardiomyopathies: Secondary | ICD-10-CM | POA: Diagnosis present

## 2022-06-24 DIAGNOSIS — Z79899 Other long term (current) drug therapy: Secondary | ICD-10-CM | POA: Diagnosis not present

## 2022-06-24 DIAGNOSIS — I5041 Acute combined systolic (congestive) and diastolic (congestive) heart failure: Secondary | ICD-10-CM | POA: Diagnosis present

## 2022-06-24 DIAGNOSIS — I483 Typical atrial flutter: Secondary | ICD-10-CM | POA: Diagnosis not present

## 2022-06-24 DIAGNOSIS — I10 Essential (primary) hypertension: Secondary | ICD-10-CM | POA: Diagnosis present

## 2022-06-24 DIAGNOSIS — I4892 Unspecified atrial flutter: Secondary | ICD-10-CM | POA: Diagnosis present

## 2022-06-24 DIAGNOSIS — I3139 Other pericardial effusion (noninflammatory): Secondary | ICD-10-CM | POA: Diagnosis present

## 2022-06-24 DIAGNOSIS — F1090 Alcohol use, unspecified, uncomplicated: Secondary | ICD-10-CM | POA: Diagnosis present

## 2022-06-24 DIAGNOSIS — I4891 Unspecified atrial fibrillation: Secondary | ICD-10-CM | POA: Diagnosis present

## 2022-06-24 DIAGNOSIS — F17200 Nicotine dependence, unspecified, uncomplicated: Secondary | ICD-10-CM | POA: Diagnosis present

## 2022-06-24 DIAGNOSIS — E119 Type 2 diabetes mellitus without complications: Secondary | ICD-10-CM | POA: Diagnosis present

## 2022-06-24 DIAGNOSIS — E876 Hypokalemia: Secondary | ICD-10-CM | POA: Diagnosis present

## 2022-06-24 DIAGNOSIS — I420 Dilated cardiomyopathy: Secondary | ICD-10-CM | POA: Diagnosis not present

## 2022-06-24 DIAGNOSIS — R000 Tachycardia, unspecified: Secondary | ICD-10-CM

## 2022-06-24 LAB — CBG MONITORING, ED
Glucose-Capillary: 105 mg/dL — ABNORMAL HIGH (ref 70–99)
Glucose-Capillary: 141 mg/dL — ABNORMAL HIGH (ref 70–99)
Glucose-Capillary: 148 mg/dL — ABNORMAL HIGH (ref 70–99)

## 2022-06-24 LAB — MAGNESIUM: Magnesium: 2 mg/dL (ref 1.7–2.4)

## 2022-06-24 LAB — HEMOGLOBIN A1C
Hgb A1c MFr Bld: 6.5 % — ABNORMAL HIGH (ref 4.8–5.6)
Mean Plasma Glucose: 139.85 mg/dL

## 2022-06-24 LAB — TSH
TSH: <0.01 u[IU]/mL — ABNORMAL LOW (ref 0.350–4.500)
TSH: <0.01 u[IU]/mL — ABNORMAL LOW (ref 0.350–4.500)

## 2022-06-24 LAB — COMPREHENSIVE METABOLIC PANEL
ALT: 35 U/L (ref 0–44)
AST: 29 U/L (ref 15–41)
Albumin: 3.4 g/dL — ABNORMAL LOW (ref 3.5–5.0)
Alkaline Phosphatase: 67 U/L (ref 38–126)
Anion gap: 9 (ref 5–15)
BUN: 7 mg/dL (ref 6–20)
CO2: 26 mmol/L (ref 22–32)
Calcium: 8.7 mg/dL — ABNORMAL LOW (ref 8.9–10.3)
Chloride: 102 mmol/L (ref 98–111)
Creatinine, Ser: 0.94 mg/dL (ref 0.61–1.24)
GFR, Estimated: >60 mL/min (ref >60)
Glucose, Bld: 96 mg/dL (ref 70–99)
Potassium: 3.8 mmol/L (ref 3.5–5.1)
Sodium: 137 mmol/L (ref 135–145)
Total Bilirubin: 0.8 mg/dL (ref 0.3–1.2)
Total Protein: 7.3 g/dL (ref 6.5–8.1)

## 2022-06-24 LAB — CBC
HCT: 39.8 % (ref 39.0–52.0)
Hemoglobin: 12.7 g/dL — ABNORMAL LOW (ref 13.0–17.0)
MCH: 29 pg (ref 26.0–34.0)
MCHC: 31.9 g/dL (ref 30.0–36.0)
MCV: 90.9 fL (ref 80.0–100.0)
Platelets: 324 10*3/uL (ref 150–400)
RBC: 4.38 MIL/uL (ref 4.22–5.81)
RDW: 14.4 % (ref 11.5–15.5)
WBC: 8.3 10*3/uL (ref 4.0–10.5)
nRBC: 0 % (ref 0.0–0.2)

## 2022-06-24 LAB — URINE DRUG SCREEN, QUALITATIVE (ARMC ONLY)
Amphetamines, Ur Screen: NOT DETECTED
Barbiturates, Ur Screen: NOT DETECTED
Benzodiazepine, Ur Scrn: NOT DETECTED
Cannabinoid 50 Ng, Ur ~~LOC~~: NOT DETECTED
Cocaine Metabolite,Ur ~~LOC~~: NOT DETECTED
MDMA (Ecstasy)Ur Screen: NOT DETECTED
Methadone Scn, Ur: NOT DETECTED
Opiate, Ur Screen: NOT DETECTED
Phencyclidine (PCP) Ur S: NOT DETECTED
Tricyclic, Ur Screen: NOT DETECTED

## 2022-06-24 LAB — URINALYSIS, COMPLETE (UACMP) WITH MICROSCOPIC
Bacteria, UA: NONE SEEN
Bilirubin Urine: NEGATIVE
Glucose, UA: NEGATIVE mg/dL
Hgb urine dipstick: NEGATIVE
Ketones, ur: NEGATIVE mg/dL
Leukocytes,Ua: NEGATIVE
Nitrite: NEGATIVE
Protein, ur: NEGATIVE mg/dL
Specific Gravity, Urine: 1.006 (ref 1.005–1.030)
pH: 6 (ref 5.0–8.0)

## 2022-06-24 LAB — LIPID PANEL
Cholesterol: 113 mg/dL (ref 0–200)
HDL: 35 mg/dL — ABNORMAL LOW (ref >40)
LDL Cholesterol: 64 mg/dL (ref 0–99)
Total CHOL/HDL Ratio: 3.2 RATIO
Triglycerides: 71 mg/dL (ref <150)
VLDL: 14 mg/dL (ref 0–40)

## 2022-06-24 LAB — BASIC METABOLIC PANEL
Anion gap: 12 (ref 5–15)
BUN: 7 mg/dL (ref 6–20)
CO2: 25 mmol/L (ref 22–32)
Calcium: 9 mg/dL (ref 8.9–10.3)
Chloride: 102 mmol/L (ref 98–111)
Creatinine, Ser: 0.92 mg/dL (ref 0.61–1.24)
GFR, Estimated: >60 mL/min (ref >60)
Glucose, Bld: 100 mg/dL — ABNORMAL HIGH (ref 70–99)
Potassium: 3.8 mmol/L (ref 3.5–5.1)
Sodium: 139 mmol/L (ref 135–145)

## 2022-06-24 LAB — LACTIC ACID, PLASMA: Lactic Acid, Venous: 1.2 mmol/L (ref 0.5–1.9)

## 2022-06-24 LAB — APTT: aPTT: 28 seconds (ref 24–36)

## 2022-06-24 LAB — HEPARIN LEVEL (UNFRACTIONATED)
Heparin Unfractionated: 0.32 IU/mL (ref 0.30–0.70)
Heparin Unfractionated: 0.29 IU/mL — ABNORMAL LOW (ref 0.30–0.70)

## 2022-06-24 LAB — PROTIME-INR
INR: 1.2 (ref 0.8–1.2)
Prothrombin Time: 15.2 seconds (ref 11.4–15.2)

## 2022-06-24 LAB — TROPONIN I (HIGH SENSITIVITY)
Troponin I (High Sensitivity): 2906 ng/L (ref <18)
Troponin I (High Sensitivity): 2727 ng/L (ref <18)

## 2022-06-24 LAB — T4, FREE: Free T4: 1.34 ng/dL — ABNORMAL HIGH (ref 0.61–1.12)

## 2022-06-24 LAB — RESP PANEL BY RT-PCR (FLU A&B, COVID) ARPGX2
Influenza A by PCR: NEGATIVE
Influenza B by PCR: NEGATIVE
SARS Coronavirus 2 by RT PCR: NEGATIVE

## 2022-06-24 LAB — BRAIN NATRIURETIC PEPTIDE: B Natriuretic Peptide: 91 pg/mL (ref 0.0–100.0)

## 2022-06-24 LAB — PROCALCITONIN: Procalcitonin: <0.1 ng/mL

## 2022-06-24 LAB — HIV ANTIBODY (ROUTINE TESTING W REFLEX): HIV Screen 4th Generation wRfx: NONREACTIVE

## 2022-06-24 MED ORDER — FUROSEMIDE 10 MG/ML IJ SOLN
40.0000 mg | Freq: Two times a day (BID) | INTRAMUSCULAR | Status: DC
  Administered 2022-06-24 – 2022-06-29 (×10): 40 mg via INTRAVENOUS
  Filled 2022-06-24 (×10): qty 4

## 2022-06-24 MED ORDER — METOPROLOL TARTRATE 5 MG/5ML IV SOLN
5.0000 mg | INTRAVENOUS | Status: DC | PRN
  Administered 2022-06-24 – 2022-06-25 (×2): 5 mg via INTRAVENOUS
  Filled 2022-06-24 (×2): qty 5

## 2022-06-24 MED ORDER — ENOXAPARIN SODIUM 80 MG/0.8ML IJ SOSY: 0.5000 mg/kg | PREFILLED_SYRINGE | INTRAMUSCULAR | Status: DC

## 2022-06-24 MED ORDER — DILTIAZEM HCL-DEXTROSE 125-5 MG/125ML-% IV SOLN (PREMIX)
INTRAVENOUS | Status: AC
  Administered 2022-06-24: 5 mg/h via INTRAVENOUS
  Filled 2022-06-24: qty 125

## 2022-06-24 MED ORDER — POTASSIUM CHLORIDE CRYS ER 20 MEQ PO TBCR
20.0000 meq | EXTENDED_RELEASE_TABLET | Freq: Once | ORAL | Status: AC
  Administered 2022-06-24: 20 meq via ORAL
  Filled 2022-06-24: qty 1

## 2022-06-24 MED ORDER — METOPROLOL TARTRATE 25 MG PO TABS
25.0000 mg | ORAL_TABLET | Freq: Four times a day (QID) | ORAL | Status: DC
  Administered 2022-06-24 – 2022-06-25 (×3): 25 mg via ORAL
  Filled 2022-06-24 (×3): qty 1

## 2022-06-24 MED ORDER — THIAMINE MONONITRATE 100 MG PO TABS
100.0000 mg | ORAL_TABLET | Freq: Every day | ORAL | Status: DC
  Administered 2022-06-24 – 2022-06-25 (×2): 100 mg via ORAL
  Filled 2022-06-24 (×2): qty 1

## 2022-06-24 MED ORDER — ADULT MULTIVITAMIN W/MINERALS CH
1.0000 | ORAL_TABLET | Freq: Every day | ORAL | Status: DC
  Administered 2022-06-24 – 2022-06-30 (×7): 1 via ORAL
  Filled 2022-06-24 (×7): qty 1

## 2022-06-24 MED ORDER — HYDROCORTISONE SOD SUC (PF) 100 MG IJ SOLR
100.0000 mg | Freq: Three times a day (TID) | INTRAMUSCULAR | Status: DC
  Administered 2022-06-24 – 2022-06-25 (×3): 100 mg via INTRAVENOUS
  Filled 2022-06-24 (×4): qty 2

## 2022-06-24 MED ORDER — POLYETHYLENE GLYCOL 3350 17 G PO PACK: 17.0000 g | PACK | Freq: Every day | ORAL | Status: DC | PRN

## 2022-06-24 MED ORDER — ADENOSINE 12 MG/4ML IV SOLN
12.0000 mg | Freq: Once | INTRAVENOUS | Status: AC
  Administered 2022-06-24: 12 mg via INTRAVENOUS
  Filled 2022-06-24: qty 4

## 2022-06-24 MED ORDER — METHIMAZOLE 10 MG PO TABS: 20.0000 mg | ORAL_TABLET | ORAL | Status: DC

## 2022-06-24 MED ORDER — ADENOSINE 6 MG/2ML IV SOLN
6.0000 mg | Freq: Once | INTRAVENOUS | Status: AC
  Administered 2022-06-24: 6 mg via INTRAVENOUS
  Filled 2022-06-24: qty 2

## 2022-06-24 MED ORDER — FOLIC ACID 1 MG PO TABS
1.0000 mg | ORAL_TABLET | Freq: Every day | ORAL | Status: DC
  Administered 2022-06-24 – 2022-06-30 (×7): 1 mg via ORAL
  Filled 2022-06-24 (×7): qty 1

## 2022-06-24 MED ORDER — INSULIN ASPART 100 UNIT/ML IJ SOLN
0.0000 [IU] | Freq: Three times a day (TID) | INTRAMUSCULAR | Status: DC
  Administered 2022-06-24 – 2022-06-25 (×4): 3 [IU] via SUBCUTANEOUS
  Administered 2022-06-26: 4 [IU] via SUBCUTANEOUS
  Administered 2022-06-26 (×2): 3 [IU] via SUBCUTANEOUS
  Administered 2022-06-27 (×2): 4 [IU] via SUBCUTANEOUS
  Filled 2022-06-24 (×9): qty 1

## 2022-06-24 MED ORDER — HEPARIN BOLUS VIA INFUSION
4000.0000 [IU] | Freq: Once | INTRAVENOUS | Status: AC
  Administered 2022-06-24: 4000 [IU] via INTRAVENOUS
  Filled 2022-06-24: qty 4000

## 2022-06-24 MED ORDER — SODIUM CHLORIDE 0.9% FLUSH
3.0000 mL | Freq: Two times a day (BID) | INTRAVENOUS | Status: DC
  Administered 2022-06-24 – 2022-06-30 (×12): 3 mL via INTRAVENOUS

## 2022-06-24 MED ORDER — DILTIAZEM HCL 25 MG/5ML IV SOLN
INTRAVENOUS | Status: AC
  Filled 2022-06-24: qty 5

## 2022-06-24 MED ORDER — HEPARIN (PORCINE) 25000 UT/250ML-% IV SOLN
1450.0000 [IU]/h | INTRAVENOUS | Status: DC
  Administered 2022-06-24: 1250 [IU]/h via INTRAVENOUS
  Administered 2022-06-25 – 2022-06-26 (×3): 1450 [IU]/h via INTRAVENOUS
  Filled 2022-06-24 (×4): qty 250

## 2022-06-24 MED ORDER — FUROSEMIDE 10 MG/ML IJ SOLN
20.0000 mg | Freq: Once | INTRAMUSCULAR | Status: AC
  Administered 2022-06-24: 20 mg via INTRAVENOUS
  Filled 2022-06-24: qty 4

## 2022-06-24 MED ORDER — DILTIAZEM HCL-DEXTROSE 125-5 MG/125ML-% IV SOLN (PREMIX)
5.0000 mg/h | INTRAVENOUS | Status: DC
  Administered 2022-06-24 – 2022-06-25 (×2): 15 mg/h via INTRAVENOUS
  Administered 2022-06-25: 10 mg/h via INTRAVENOUS
  Administered 2022-06-26 (×2): 15 mg/h via INTRAVENOUS
  Administered 2022-06-27: 10 mg/h via INTRAVENOUS
  Filled 2022-06-24 (×6): qty 125

## 2022-06-24 MED ORDER — METHIMAZOLE 10 MG PO TABS
20.0000 mg | ORAL_TABLET | ORAL | Status: DC
  Administered 2022-06-24: 20 mg via ORAL
  Filled 2022-06-24 (×2): qty 2

## 2022-06-24 MED ORDER — ALBUTEROL SULFATE (2.5 MG/3ML) 0.083% IN NEBU: 3.0000 mL | INHALATION_SOLUTION | RESPIRATORY_TRACT | Status: DC | PRN

## 2022-06-24 MED ORDER — ACETAMINOPHEN 325 MG PO TABS: 650.0000 mg | ORAL_TABLET | Freq: Four times a day (QID) | ORAL | Status: DC | PRN

## 2022-06-24 MED ORDER — HEPARIN BOLUS VIA INFUSION
1500.0000 [IU] | Freq: Once | INTRAVENOUS | Status: AC
  Administered 2022-06-24: 1500 [IU] via INTRAVENOUS
  Filled 2022-06-24: qty 1500

## 2022-06-24 MED ORDER — METHIMAZOLE 10 MG PO TABS
20.0000 mg | ORAL_TABLET | ORAL | Status: DC
  Administered 2022-06-24 – 2022-06-25 (×4): 20 mg via ORAL
  Filled 2022-06-24 (×5): qty 2

## 2022-06-24 MED ORDER — ACETAMINOPHEN 650 MG RE SUPP: 650.0000 mg | Freq: Four times a day (QID) | RECTAL | Status: DC | PRN

## 2022-06-24 MED ORDER — DILTIAZEM LOAD VIA INFUSION
10.0000 mg | Freq: Once | INTRAVENOUS | Status: AC
  Administered 2022-06-24: 10 mg via INTRAVENOUS
  Filled 2022-06-24: qty 10

## 2022-06-24 NOTE — ED Provider Notes (Signed)
[facility] Provider Note   Event Date/Time   First MD Initiated Contact with Patient 06/24/22 1153     (approximate) History  Shortness of Breath  HPI Arthur Barnes is a 36 y.o. male with a stated past medical history of type 2 diabetes and asthma who presents for shortness of breath that is been worsening over the last 24 hours.  Patient states that he has an albuterol inhaler at home but it has not been working over the past 24 hours.  Patient also complains of bilateral ankle swelling.  Patient denies any personal history of heart failure however states that it "runs in my family". ROS: Patient currently denies any vision changes, tinnitus, difficulty speaking, facial droop, sore throat, chest pain, abdominal pain, nausea/vomiting/diarrhea, dysuria, or weakness/numbness/paresthesias in any extremity   Physical Exam  Triage Vital Signs: ED Triage Vitals  Enc Vitals Group     BP 06/24/22 1136 (!) 175/135     Pulse Rate 06/24/22 1136 (!) 155     Resp 06/24/22 1136 18     Temp 06/24/22 1136 98 F (36.7 C)     Temp src --      SpO2 06/24/22 1136 98 %     Weight --      Height --      Head Circumference --      Peak Flow --      Pain Score 06/24/22 1135 0     Pain Loc --      Pain Edu? --      Excl. in GC? --    Most recent vital signs: Vitals:   06/24/22 1505 06/24/22 1530  BP: (!) 137/117 (!) 141/98  Pulse: (!) 147 (!) 155  Resp: (!) 25 17  Temp:    SpO2: 93% 94%   General: Awake, oriented x4. CV:  Good peripheral perfusion.  Resp:  Increased effort.  Expiratory wheezes over bilateral lung fields Abd:  No distention.  Other:  Obese middle-aged African-American male laying in bed in mild respiratory distress. ED Results / Procedures / Treatments  Labs (all labs ordered are listed, but only abnormal results are displayed) Labs Reviewed  CBC - Abnormal; Notable for the following components:      Result Value   Hemoglobin 12.7 (*)    All  other components within normal limits  BASIC METABOLIC PANEL - Abnormal; Notable for the following components:   Glucose, Bld 100 (*)    All other components within normal limits  COMPREHENSIVE METABOLIC PANEL - Abnormal; Notable for the following components:   Calcium 8.7 (*)    Albumin 3.4 (*)    All other components within normal limits  TSH - Abnormal; Notable for the following components:   TSH <0.010 (*)    All other components within normal limits  TSH - Abnormal; Notable for the following components:   TSH <0.010 (*)    All other components within normal limits  LIPID PANEL - Abnormal; Notable for the following components:   HDL 35 (*)    All other components within normal limits  CBG MONITORING, ED - Abnormal; Notable for the following components:   Glucose-Capillary 105 (*)    All other components within normal limits  TROPONIN I (HIGH SENSITIVITY) - Abnormal; Notable for the following components:   Troponin I (High Sensitivity) 2,906 (*)    All other components within normal limits  TROPONIN I (HIGH SENSITIVITY) - Abnormal; Notable for the following components:  Troponin I (High Sensitivity) 2,727 (*)    All other components within normal limits  RESP PANEL BY RT-PCR (FLU A&B, COVID) ARPGX2  BRAIN NATRIURETIC PEPTIDE  MAGNESIUM  HIV ANTIBODY (ROUTINE TESTING W REFLEX)  HEMOGLOBIN A1C  APTT  PROTIME-INR  T4, FREE   EKG ED ECG REPORT I, Naaman Plummer, the attending physician, personally viewed and interpreted this ECG. Date: 06/24/2022 EKG Time: 1139 Rate: 155 Rhythm: Tachycardic sinus rhythm QRS Axis: normal Intervals: normal ST/T Wave abnormalities: normal Narrative Interpretation: Tachycardic sinus rhythm.  No evidence of acute ischemia RADIOLOGY ED MD interpretation: 2 view chest x-ray interpreted by me and shows cardiomegaly with questionably enlarged heart as well or as possible pericardial effusion and signs of volume overload with pulmonary vascular  congestion -Agree with radiology assessment Official radiology report(s): DG Chest 2 View  Result Date: 06/24/2022 CLINICAL DATA:  Shortness of breath.  Labored breathing. EXAM: CHEST - 2 VIEW COMPARISON:  Chest x-rays dated 07/10/2021 and 12/27/2018. FINDINGS: Cardiomegaly, questionably enlarged compared to the previous exams. There is central pulmonary vascular congestion. No confluence opacity to suggest a consolidating pneumonia. Osseous structures about the chest are unremarkable. IMPRESSION: 1. Cardiomegaly, questionably enlarged compared to the previous exams. Pericardial effusion can not be excluded. 2. Probable CHF/volume overload. Electronically Signed   By: Franki Cabot M.D.   On: 06/24/2022 12:28   PROCEDURES: Critical Care performed: Yes, see critical care procedure note(s) .1-3 Lead EKG Interpretation  Performed by: Naaman Plummer, MD Authorized by: Naaman Plummer, MD     Interpretation: abnormal     ECG rate:  154   ECG rate assessment: tachycardic     Rhythm: atrial flutter     Ectopy: none     Conduction: normal   CRITICAL CARE Performed by: Naaman Plummer  Total critical care time: 51 minutes  Critical care time was exclusive of separately billable procedures and treating other patients.  Critical care was necessary to treat or prevent imminent or life-threatening deterioration.  Critical care was time spent personally by me on the following activities: development of treatment plan with patient and/or surrogate as well as nursing, discussions with consultants, evaluation of patient's response to treatment, examination of patient, obtaining history from patient or surrogate, ordering and performing treatments and interventions, ordering and review of laboratory studies, ordering and review of radiographic studies, pulse oximetry and re-evaluation of patient's condition.  MEDICATIONS ORDERED IN ED: Medications  albuterol (PROVENTIL) (2.5 MG/3ML) 0.083% nebulizer  solution 3 mL (has no administration in time range)  diltiazem (CARDIZEM) 25 MG/5ML injection (  See Procedure Record 06/24/22 1422)  diltiazem (CARDIZEM) 1 mg/mL load via infusion 10 mg (10 mg Intravenous Bolus from Bag 06/24/22 1419)    And  diltiazem (CARDIZEM) 125 mg in dextrose 5% 125 mL (1 mg/mL) infusion (15 mg/hr Intravenous Rate/Dose Change 06/24/22 1545)  sodium chloride flush (NS) 0.9 % injection 3 mL (3 mLs Intravenous Not Given 06/24/22 1422)  acetaminophen (TYLENOL) tablet 650 mg (has no administration in time range)    Or  acetaminophen (TYLENOL) suppository 650 mg (has no administration in time range)  polyethylene glycol (MIRALAX / GLYCOLAX) packet 17 g (has no administration in time range)  multivitamin with minerals tablet 1 tablet (1 tablet Oral Given 16/1/09 6045)  folic acid (FOLVITE) tablet 1 mg (1 mg Oral Given 06/24/22 1513)  thiamine (VITAMIN B1) tablet 100 mg (100 mg Oral Given 06/24/22 1514)  insulin aspart (novoLOG) injection 0-20 Units (has no administration in  time range)  furosemide (LASIX) injection 40 mg (has no administration in time range)  furosemide (LASIX) injection 20 mg (20 mg Intravenous Given 06/24/22 1247)  adenosine (ADENOCARD) 6 MG/2ML injection 6 mg (6 mg Intravenous Given 06/24/22 1404)  adenosine (ADENOCARD) 12 MG/4ML injection 12 mg (12 mg Intravenous Given 06/24/22 1409)  furosemide (LASIX) injection 20 mg (20 mg Intravenous Given 06/24/22 1514)  potassium chloride SA (KLOR-CON M) CR tablet 20 mEq (20 mEq Oral Given 06/24/22 1514)   IMPRESSION / MDM / ASSESSMENT AND PLAN / ED COURSE  I reviewed the triage vital signs and the nursing notes.                             The patient is on the cardiac monitor to evaluate for evidence of arrhythmia and/or significant heart rate changes. Patient's presentation is most consistent with acute presentation with potential threat to life or bodily function. Endorses dyspnea, endorses LE edema Denies any history  of heart failure  Workup: ECG, CBC, BMP, Troponin, BNP, CXR Findings: EKG: No STEMI and no evidence of Brugadas sign, delta wave, epsilon wave, significantly prolonged QTc, or malignant arrhythmia. BNP: 90 CXR: Cardiomegaly with pulmonary vascular congestion Based on history, exam and findings, presentation most consistent with acute on chronic heart failure. Low suspicion for PNA, ACS, tamponade, aortic dissection. Interventions: Oxygen, Diuresis  Reassessment: Symptoms improved in ED with oxygen and diuresis  Disposition (Stable but not significantly improved): Admit to medicine for further monitoring and for improvement of medication regimen to control symptoms.    FINAL CLINICAL IMPRESSION(S) / ED DIAGNOSES   Final diagnoses:  Acute respiratory failure with hypoxia (HCC)  Acute pulmonary edema (HCC)   Rx / DC Orders   ED Discharge Orders     None      Note:  This document was prepared using Dragon voice recognition software and may include unintentional dictation errors.   Merwyn Katos, MD 06/24/22 [phone]

## 2022-06-24 NOTE — Progress Notes (Signed)
ANTICOAGULATION CONSULT NOTE - Initial Consult  Pharmacy Consult for IV heparin Indication: chest pain/ACS  No Known Allergies  Patient Measurements: Height: 5\' 6"  (167.6 cm) Weight: 133.8 kg (295 lb) IBW/kg (Calculated) : 63.8 Heparin Dosing Weight: 96 kg  Vital Signs: Temp: 98 F (36.7 C) (10/09 1136) BP: 137/117 (10/09 1505) Pulse Rate: 147 (10/09 1505)  Labs: Recent Labs    06/24/22 1150 06/24/22 1414  HGB 12.7*  --   HCT 39.8  --   PLT 324  --   CREATININE 0.94  0.92  --   TROPONINIHS 2,906* 2,727*    Estimated Creatinine Clearance: 144.1 mL/min (by C-G formula based on SCr of 0.92 mg/dL).   Medical History: Past Medical History:  Diagnosis Date   Asthma    Hypertension     Medications:  No PTA anticoagulation per my chart review  Assessment: 36 year old male with new onset CHF and c/f NSTEMI.  BL PT-INR 1.2, BL aPTT 28. H&H stable  Goal of Therapy:  Heparin level 0.3-0.7 units/ml Monitor platelets by anticoagulation protocol: Yes   Plan:  Give 4,000 units bolus x 1 Start heparin infusion at 1250 units/hr Check anti-Xa level in 6 hours and daily while on heparin Continue to monitor H&H and platelets   Wynelle Cleveland 06/24/2022,3:32 PM

## 2022-06-24 NOTE — ED Triage Notes (Signed)
Pt comes with c.o bilateral ankle swelling. Pt states this started few days ago. Pt has labored breathing and accessory muscle use noted. Pt states he is SOB and uses cpap at night. Pt has hx of asthma. Pt is diabetic.

## 2022-06-24 NOTE — Consult Note (Signed)
NAME:  Arthur Barnes, MRN:  [id number], DOB:  05-08-1986, LOS: 0 ADMISSION DATE:  06/24/2022, CONSULTATION DATE: 06/24/2022 REFERRING MD:  Revonda Humphrey, CHIEF COMPLAINT: Shortness of breath   HPI  36 y.o male with significant PMH of OSA on CPAP,T2DM, asthma, HTN, prior tobacco abuse, and EtOH abuse who presented to the ED with chief complaints of shortness of breath, extremity edema, abdominal distention and orthopnea x1 week.   ED Course: Initial vital signs showed HR of 155 beats/minute, BP 175/135 mm Hg, the RR 18 breaths/minute, and the oxygen saturation 98% on RA and a temperature of 98.F (36.7C).   Pertinent Labs/Diagnostics Findings: Chemistry: Unremarkable CBC: Unremarkable Other Lab findings: TSH <0.010, Free T3 pending, Free T4  1.34, Lactic acid: 1.2 COVID PCR: Negative, Troponin: 2906, BNP: 91 Imaging:  CXR> cardiomegaly questionable pericardial effusion probable CHF/volume overload  EKG shows narrow complex tachycardia, patient received 6 mg of IV adenosine push, second dose 12 mg IV adenosine push without improvement.  Cardiology was consulted who reviewed EKG and thought to be consistent with atrial flutter with 2-1 AV block with RBBB likely in the setting of uncontrolled hyperthyroidism.  Patient also received IV Lasix x1 dose and IV diltiazem without improvement therefore was started on oral and IV metoprolol by cardiology.  He was also started on heparin drip for elevated troponin.  Patient admitted to hospitalist service.  Due to persistent tachycardia with high risk for decompensation, PCCM was consulted.  Past Medical History  OSA on CPAP,T2DM, asthma, HTN, prior tobacco abuse, and EtOH abuse   Significant Hospital Events   10/9: Admitted to stepdown with new onset Afib/Aflutter in the setting of uncontrolled hyperthyroidism and probable new onset CHF. PCCM consulted.  Consults:  Cardiology PCCM  Procedures:  None  Significant Diagnostic Tests:  10/9:  Chest Xray>1. Cardiomegaly, questionably enlarged compared to the previous exams. Pericardial effusion can not be excluded. 2. Probable CHF/volume overload.   Micro Data:  10/9: SARS-CoV-2 PCR> negative 10/9: Influenza PCR> negative 10/9: Blood culture x2> 10/9: Urine Culture> 10/9: MRSA PCR>>   Antimicrobials:  None  OBJECTIVE  Blood pressure (!) 114/97, pulse 70, temperature 98.4 F (36.9 C), temperature source Oral, resp. rate 17, height 5\' 6"  (1.676 m), weight 133.8 kg, SpO2 96 %.        Intake/Output Summary (Last 24 hours) at 06/24/2022 1914 Last data filed at 06/24/2022 1506 Gross per 24 hour  Intake --  Output 600 ml  Net -600 ml   Filed Weights   06/24/22 1423  Weight: 133.8 kg    Physical Examination  GENERAL: 36 year-old critically ill patient lying in the bed with no acute distress.  EYES: Pupils equal, round, reactive to light and accommodation. No scleral icterus. Extraocular muscles intact.  HEENT: Head atraumatic, normocephalic. Oropharynx and nasopharynx clear.  NECK:  Supple, no jugular venous distention. No thyroid enlargement, no tenderness.  LUNGS: Decreased breath sounds bilaterally, no wheezing, rales,rhonchi or crepitation. Mild use of accessory muscles of respiration.  CARDIOVASCULAR: Irregular. No murmurs, rubs, or gallops.  ABDOMEN: Soft, nontender, nondistended. Bowel sounds present. No organomegaly or mass.  EXTREMITIES: Bilateral lower extremities edema 2+ edema,  cyanosis, or clubbing.  NEUROLOGIC: Cranial nerves II through XII are intact.  Muscle strength 5/5 in all extremities. Sensation intact. Gait not checked.  PSYCHIATRIC: The patient is alert and oriented x 3.  SKIN: No obvious rash, lesion, or ulcer.   Labs/imaging that I havepersonally reviewed  (right click and "Reselect all SmartList  Selections" daily)     Labs   CBC: Recent Labs  Lab 06/24/22 1150  WBC 8.3  HGB 12.7*  HCT 39.8  MCV 90.9  PLT 324    Basic Metabolic  Panel: Recent Labs  Lab 06/24/22 1150 06/24/22 1506  NA 137  139  --   K 3.8  3.8  --   CL 102  102  --   CO2 26  25  --   GLUCOSE 96  100*  --   BUN 7  7  --   CREATININE 0.94  0.92  --   CALCIUM 8.7*  9.0  --   MG  --  2.0   GFR: Estimated Creatinine Clearance: 144.1 mL/min (by C-G formula based on SCr of 0.92 mg/dL). Recent Labs  Lab 06/24/22 1150  WBC 8.3    Liver Function Tests: Recent Labs  Lab 06/24/22 1150  AST 29  ALT 35  ALKPHOS 67  BILITOT 0.8  PROT 7.3  ALBUMIN 3.4*   No results for input(s): "LIPASE", "AMYLASE" in the last 168 hours. No results for input(s): "AMMONIA" in the last 168 hours.  ABG No results found for: "PHART", "PCO2ART", "PO2ART", "HCO3", "TCO2", "ACIDBASEDEF", "O2SAT"   Coagulation Profile: Recent Labs  Lab 06/24/22 1548  INR 1.2    Cardiac Enzymes: No results for input(s): "CKTOTAL", "CKMB", "CKMBINDEX", "TROPONINI" in the last 168 hours.  HbA1C: No results found for: "HGBA1C"  CBG: Recent Labs  Lab 06/24/22 1146 06/24/22 1614  GLUCAP 105* 141*    Review of Systems:   Review of Systems  Constitutional:  Negative for chills, diaphoresis, fever, malaise/fatigue and weight loss.  HENT: Negative.    Eyes: Negative.   Respiratory:  Positive for shortness of breath and wheezing. Negative for cough, hemoptysis and sputum production.   Cardiovascular:  Positive for chest pain, palpitations, orthopnea, leg swelling and PND.  Gastrointestinal:  Positive for abdominal pain, diarrhea and nausea.  Genitourinary: Negative.   Musculoskeletal: Negative.   Skin: Negative.   Neurological: Negative.   Endo/Heme/Allergies: Negative.   Psychiatric/Behavioral: Negative.     Past Medical History  He,  has a past medical history of Asthma and Hypertension.   Surgical History   History reviewed. No pertinent surgical history.   Social History   reports that he has been smoking. He has never used smokeless tobacco. He  reports current alcohol use. He reports that he does not use drugs.   Family History   His family history includes Hypertension in his mother; Thyroid disease in his maternal aunt.   Allergies No Known Allergies   Home Medications  Prior to Admission medications   Not on File  Scheduled Meds:  diltiazem       folic acid  1 mg Oral Daily   furosemide  40 mg Intravenous Q12H   hydrocortisone sod succinate (SOLU-CORTEF) inj  100 mg Intravenous Q8H   insulin aspart  0-20 Units Subcutaneous TID WC   methimazole  20 mg Oral Q4H   metoprolol tartrate  25 mg Oral Q6H   multivitamin with minerals  1 tablet Oral Daily   sodium chloride flush  3 mL Intravenous Q12H   thiamine  100 mg Oral Daily   Continuous Infusions:  diltiazem (CARDIZEM) infusion 15 mg/hr (06/24/22 1545)   heparin 1,250 Units/hr (06/24/22 1700)   PRN Meds:.acetaminophen **OR** acetaminophen, albuterol, diltiazem, metoprolol tartrate, polyethylene glycol   Active Hospital Problem list     Assessment & Plan:   Thyroid Crisis  TSH: <0.0, Free T4: 1.34 -Blood cultures & infectious workup as indicated -Hold abx for now although have low suspicion for infectious process -Continue Methimazole 20 mg Q4hr -Start hydrocortisone 300 mg IV x 1 dose then maintenance dose of 100 mg IV Q8hr. -Continue Metoprolol 25 mg Q6hr -Due to concerns for CHF, may require permissive tachycardia to achieve adequate perfusion with targeting a heart rate below ~120 b/m  -Avoid salicylates or NSAIDs, may increase free thyroid hormone levels. -Gentle hydration with PRN Vasopressor (phenylephrine) for MAP goal>65  AFib+RVR, Likely provoked in the setting of uncontrolled hyperthyroidism Elevated Troponin likely demand ischemia in the setting of above -Serial EKGs -Trend Troponins -TTEcho -Diltiazem 0.25mg /kg (15mg ) IV x1 then maintain 5-15mg /hr drip -Metoprolol 2.5-5mg  IV up to x3 then PO maintenance -continue Heparin as above -Keep NPO  for option of TEEcho + DCCardioversion -Cardiology Consult, input appreciated  Acute Decompensated CHF PMHx: OSA on CPAP, HTN, Asthma, Prior tobacco use -Supplemental O2 as needed to maintain O2 saturations 88 to 92% -Continuous cardiac monitoring -Maintain MAP greater than 65 -IV Lasix as blood pressure and renal function permits; currently on Lasix 40 mg IV BID -Cardiology following, appreciate input -Repeat 2D Echocardiogram  EtOH Abuse -Follow CMP, INR, Daily BMP+Mg -CIWA symptom triggered PRNs -Daily Thiamine, Folate, MVI once tolerating PO -SW consult for cessation resources -PT/OT evaluation for mobility   T2 Diabetes mellitus HgbA1c 6.5 -CBG's AC & hs; Target range of 140 to 180 -SSI -Follow ICU Hypo/Hyperglycemia protocol    Best practice:  Diet:  NPO Pain/Anxiety/Delirium protocol (if indicated): Yes (RASS goal 0) VAP protocol (if indicated): Not indicated DVT prophylaxis: Systemic AC GI prophylaxis: N/A Glucose control:  SSI Yes Central venous access:  N/A Arterial line:  N/A Foley:  N/A Mobility:  bed rest  PT consulted: N/A Last date of multidisciplinary goals of care discussion [06/24/22] Code Status:  full code Disposition: ICU   = Goals of Care = Code Status Order: FULL  Primary Emergency Contact: Nouri,NANCY L Wishes to pursue full aggressive treatment and intervention options, including CPR and intubation, but goals of care will be addressed on going with family if that should become necessary.  Critical care time: 45 minutes       08/24/22, DNP, CCRN, FNP-C, AGACNP-BC Acute Care Nurse Practitioner [facility]  PCCM on call pager [phone] until 7 am

## 2022-06-24 NOTE — ED Notes (Signed)
Message sent to Charleen Kirks, MD and Fletcher Anon, MD regarding patient's HR of 156.

## 2022-06-24 NOTE — ED Notes (Signed)
Patient placed on pads. Cheri Fowler, MD, cardiology, and hospitalist at bedside.

## 2022-06-24 NOTE — Assessment & Plan Note (Signed)
Patient presenting with severe sinus tachycardia, acute heart failure and NSTEMI.  Initial work-up revealed undetectable TSH at less than 0.01 and elevated free T4 at 1.34.  This is concerning for evidence of thyroid storm.  Discussed with cardiology and PCCM.  - Admit to stepdown unit - PCCM and cardiology following; appreciate their recommendations - Start methimazole 20 mg every 4 hours - Start hydrocortisone 100 mg every 8 hours - Will need evaluation for underlying etiology once stabilized

## 2022-06-24 NOTE — Assessment & Plan Note (Signed)
Patient states he has a past medical history of hypertension and does not currently take antihypertensives.  We will need to start a stable regimen prior to discharge

## 2022-06-24 NOTE — Consult Note (Signed)
Cardiology Consultation:   Patient ID: Arthur Barnes; [id number]; [birth year]   Admit date: 06/24/2022 Date of Consult: 06/24/2022  Primary Care Provider: [facility] [place] Primary Cardiologist: new - consult by Fletcher Anon Primary Electrophysiologist:  None   Patient Profile:   Arthur Barnes is a 36 y.o. male with a hx of HTN, asthma, obesity, and OSA on CPAP who is being seen today for the evaluation of new onset CHF at the request of Dr. Cheri Fowler.  History of Present Illness:   Arthur Barnes has no previously known cardiac history. He presented to [facility] on 10/9 with a 1 week history of increased lower extremity swelling, abdominal distention, dyspnea, orthopnea, and early satiety.  Leading up to the symptoms, he did have some intermittent chest pressure associated with shortness of breath with exertion.  However, chest pressure has been less prevalent leading up to his presentation.  No palpitations, dizziness, presyncope, or syncope.  He feels like his weight is possibly up approximately 10 pounds over this time span.  He drinks 1 beer nightly but occasionally will have 3 shots of liquor with friends.  He denies tobacco or illicit substance use.  He drinks 1 cup of coffee if tired at work.  There is no family history of premature CAD or sudden cardiac death.  Upon his arrival, he was noted to be hypertensive with a BP of 175/135 with a heart rate of 155 bpm and oxygen saturation of 98%. He has been placed on 2 L supplemental oxygen in the ED. Initial high sensitivity troponin 2906. BNP 91. EKG showed a narrow complex tachycardia, 155 bpm, RBBB with diffuse nonspecific s/t changes. In the ED, he was given IV Lasix 20 mg. His heart rate has persisted around 155 bpm while in the ED. 6 mg of IV adenosine pushed in the ED without improvement in rate or noted symptoms this was followed by a 5 mL saline flush.  IV was confirmed to be widely patent with pullback.  He received a 12 mg IV adenosine rapid push  without improvement/slowing of heart rate.  He remains in a narrow complex tachycardia with a rate of 155 and is without symptoms of chest pain.  Dyspnea stable.  Bedside echo performed prior to cardiology arrival was without significant pericardial effusion.   Past Medical History:  Diagnosis Date   Asthma    Hypertension     History reviewed. No pertinent surgical history.   Home Meds: Prior to Admission medications   Not on File    Inpatient Medications: Scheduled Meds:  diltiazem       folic acid  1 mg Oral Daily   furosemide  20 mg Intravenous Once   furosemide  40 mg Intravenous Q12H   insulin aspart  0-20 Units Subcutaneous TID WC   multivitamin with minerals  1 tablet Oral Daily   potassium chloride  20 mEq Oral Once   sodium chloride flush  3 mL Intravenous Q12H   thiamine  100 mg Oral Daily   Continuous Infusions:  diltiazem (CARDIZEM) infusion 5 mg/hr (06/24/22 1421)   PRN Meds: acetaminophen **OR** acetaminophen, albuterol, diltiazem, polyethylene glycol  Allergies:  No Known Allergies  Social History:   Social History   Socioeconomic History   Marital status: Single    Spouse name: Not on file   Number of children: Not on file   Years of education: Not on file   Highest education level: Not on file  Occupational History   Not on  file  Tobacco Use   Smoking status: Some Days   Smokeless tobacco: Never  Vaping Use   Vaping Use: Never used  Substance and Sexual Activity   Alcohol use: Yes   Drug use: Never   Sexual activity: Not on file  Other Topics Concern   Not on file  Social History Narrative   Not on file   Social Determinants of Health   Financial Resource Strain: Not on file  Food Insecurity: Not on file  Transportation Needs: Not on file  Physical Activity: Not on file  Stress: Not on file  Social Connections: Not on file  Intimate Partner Violence: Not on file     Family History:   Family History  Problem Relation Age of  Onset   Hypertension Mother     ROS:  Review of Systems  Constitutional:  Positive for malaise/fatigue. Negative for chills, diaphoresis, fever and weight loss.  HENT:  Negative for congestion.   Eyes:  Negative for discharge and redness.  Respiratory:  Positive for shortness of breath. Negative for cough, sputum production and wheezing.   Cardiovascular:  Positive for chest pain, orthopnea and leg swelling. Negative for palpitations, claudication and PND.  Gastrointestinal:  Negative for abdominal pain, heartburn, nausea and vomiting.  Musculoskeletal:  Negative for falls and myalgias.  Skin:  Negative for rash.  Neurological:  Negative for dizziness, tingling, tremors, sensory change, speech change, focal weakness, loss of consciousness and weakness.  Endo/Heme/Allergies:  Does not bruise/bleed easily.  Psychiatric/Behavioral:  Negative for substance abuse. The patient is not nervous/anxious.   All other systems reviewed and are negative.     Physical Exam/Data:   Vitals:   06/24/22 1300 06/24/22 1330 06/24/22 1412 06/24/22 1423  BP: (!) 169/122 (!) 138/105 (!) 160/120   Pulse: (!) 152 (!) 154 (!) 155   Resp: (!) 29 (!) 22 (!) 22   Temp:      SpO2: 95% 100% 99%   Weight:    133.8 kg  Height:    5\' 6"  (1.676 m)   No intake or output data in the 24 hours ending 06/24/22 1447 Filed Weights   06/24/22 1423  Weight: 133.8 kg   Body mass index is 47.61 kg/m.   Physical Exam: General: Well developed, well nourished, in no acute distress. Head: Normocephalic, atraumatic, sclera non-icteric, no xanthomas, nares without discharge.  Neck: Negative for carotid bruits. JVD difficult to assess secondary to body habitus. Lungs: Diminished breath sounds along the bilateral bases. Breathing is unlabored. Heart: Tachycardic with S1 S2. No murmurs, rubs, or gallops appreciated. Abdomen: Soft, non-tender, mildly distended with normoactive bowel sounds. No hepatomegaly. No  rebound/guarding. No obvious abdominal masses. Msk:  Strength and tone appear normal for age. Extremities: No clubbing or cyanosis. 2+ bilateral lower extremity pitting edema. Distal pedal pulses are 2+ and equal bilaterally. Neuro: Alert and oriented X 3. No facial asymmetry. No focal deficit. Moves all extremities spontaneously. Psych:  Responds to questions appropriately with a normal affect.   EKG:  The EKG was personally reviewed and demonstrates: narrow complex tachycardia, 155 bpm, RBBB (known), nonspecific st/t changes Telemetry:  Telemetry was personally reviewed and demonstrates: narrow complex tachycardia, 155 bpm  Weights: Filed Weights   06/24/22 1423  Weight: 133.8 kg    Relevant CV Studies:  None available for review   Laboratory Data:  Chemistry Recent Labs  Lab 06/24/22 1150  NA 137  139  K 3.8  3.8  CL 102  102  CO2 26  25  GLUCOSE 96  100*  BUN 7  7  CREATININE 0.94  0.92  CALCIUM 8.7*  9.0  GFRNONAA >60  >60  ANIONGAP 9  12    Recent Labs  Lab 06/24/22 1150  PROT 7.3  ALBUMIN 3.4*  AST 29  ALT 35  ALKPHOS 67  BILITOT 0.8   Hematology Recent Labs  Lab 06/24/22 1150  WBC 8.3  RBC 4.38  HGB 12.7*  HCT 39.8  MCV 90.9  MCH 29.0  MCHC 31.9  RDW 14.4  PLT 324   Cardiac EnzymesNo results for input(s): "TROPONINI" in the last 168 hours. No results for input(s): "TROPIPOC" in the last 168 hours.  BNP Recent Labs  Lab 06/24/22 1150  BNP 91.0    DDimer No results for input(s): "DDIMER" in the last 168 hours.  Radiology/Studies:  DG Chest 2 View  Result Date: 06/24/2022 IMPRESSION: 1. Cardiomegaly, questionably enlarged compared to the previous exams. Pericardial effusion can not be excluded. 2. Probable CHF/volume overload. Electronically Signed   By: Bary Richard M.D.   On: 06/24/2022 12:28    Assessment and Plan:   1. Acute CHF: -Type unknown at this time, possibly tachycardia mediated -Echo pending  -Escalate IV  Lasix to 40 mg bid upon admission with an additional 20 mg now with close monitoring of renal function and UOP -Defer addition beta blocker at this time with decompensated CHF -Escalate GDMT as able -Stat bedside echo reported without significant pericardial effusion -Daily weights -Strict I/O  2. NSTEMI: -Currently, without chest pain -Initial high sensitivity troponin 2906, cycle until peak -Echo pending -Start heparin gtt -ASA -Will need R/LHC prior to discharge once his ventricular rates are improved and he has undergone adequate diuresis  -Check lipid and A1c for further risk stratification   3.  Narrow complex tachycardia: -Presented with a narrow complex tachycardia with a rate of 155 bpm that persisted in the ED -IV adenosine pushed in the ED at 6 mg and 12 mg without breaking or slowing of rhythm/rate respectively (pacer pads in place) -Hemodynamically stable -Give 10 mg IV Cardizem bolus followed by diltiazem drip -Check TSH and magnesium  -Replete potassium to goal 4.0   4. OSA: -CPAP     For questions or updates, please contact [facility] Please consult [email or web address] for contact info under Cardiology/STEMI.   Signed, Eula Listen, PA-C [facility] Pager: [phone] 06/24/2022, 2:47 PM

## 2022-06-24 NOTE — Progress Notes (Signed)
ANTICOAGULATION CONSULT NOTE - Initial Consult  Pharmacy Consult for IV heparin Indication: chest pain/ACS  No Known Allergies  Patient Measurements: Height: 5\' 6"  (167.6 cm) Weight: 133.8 kg (295 lb) IBW/kg (Calculated) : 63.8 Heparin Dosing Weight: 96 kg  Vital Signs: Temp: 98.7 F (37.1 C) (10/09 2054) Temp Source: Oral (10/09 2054) BP: 115/67 (10/09 2330) Pulse Rate: 62 (10/09 2330)  Labs: Recent Labs    06/24/22 1150 06/24/22 1414 06/24/22 1548 06/24/22 2055 06/24/22 2302  HGB 12.7*  --   --   --   --   HCT 39.8  --   --   --   --   PLT 324  --   --   --   --   APTT  --   --  28  --   --   LABPROT  --   --  15.2  --   --   INR  --   --  1.2  --   --   HEPARINUNFRC  --   --   --  0.32 0.29*  CREATININE 0.94  0.92  --   --   --   --   TROPONINIHS 2,906* 2,727*  --   --   --      Estimated Creatinine Clearance: 144.1 mL/min (by C-G formula based on SCr of 0.92 mg/dL).   Medical History: Past Medical History:  Diagnosis Date   Asthma    Hypertension     Medications:  Heparin Dosing Weight: 96 kg No PTA anticoagulation per my chart review  Assessment: 36 year old male with h/o HTN, uncontrolled T2DM, and OSA presenting with new onset CHF and c/f NSTEMI. Pharmacy consulted for initiation/mgmt of heparin gtt.  Date Time aPTT/HL Rate/Comment 1009 2302 0.29  Subthera; 1250>>1450 un/hr       BL PT-INR 1.2, BL aPTT 28. H&H stable  Goal of Therapy:  Heparin level 0.3-0.7 units/ml Monitor platelets by anticoagulation protocol: Yes   Plan:  Pt HL slightly subtherapeutic at 0.29 (goal 0.3-0.7). Bolus and increase as below Give 1500 units bolus x1; then increase heparin infusion to 1450 units/hr Check anti-Xa level in 6 hours and then daily once consecutively therapeutic. Continue to monitor H&H and platelets daily while on heparin gtt.   Arthur Barnes 06/24/2022,11:42 PM

## 2022-06-24 NOTE — Assessment & Plan Note (Signed)
-   CPAP at night while admitted - Encouraged improved compliance at home postdischarge

## 2022-06-24 NOTE — Assessment & Plan Note (Signed)
-   Albuterol as needed for wheezing

## 2022-06-24 NOTE — Assessment & Plan Note (Signed)
Patient states he is drinking at least 3-4 alcohol beverages per day 5 days out of the week and then 7-9 beverages per day on the other 2 days.  Although patient is certainly misusing alcohol, no evidence of abuse at this time.  -Start daily folic acid and thiamine

## 2022-06-24 NOTE — Assessment & Plan Note (Signed)
Patient presenting with elevated troponins on admission in the setting of severe tachycardia.  He did have 1 episode of chest pain but is currently not experiencing any at this time.  Heparin drip initiated by cardiology and will likely need heart catheterization prior to discharge.  Troponins have peaked at 2900 and are downtrending at this time.  LDL within goal at this time  - Heparin infusion per cardiology - Aspirin 81 mg daily - Formal echocardiogram pending improvement in tachycardia

## 2022-06-24 NOTE — Assessment & Plan Note (Signed)
Patient presenting with narrow complex tachycardia with right bundle branch block with rates persistently in the 150s.  6 mg of adenosine and then 12 mg of adenosine without any improvement in tachycardia.  Diltiazem infusion started and titrated to maximum dose without improvement.  Given results demonstrating thyroid storm, will transition to beta-blocker.  - Metoprolol 5 mg IV injection every 4 hours as needed - Metoprolol p.o. 25 mg every 6 hours

## 2022-06-24 NOTE — H&P (Signed)
History and Physical    Patient: Arthur Barnes NID:[id number] DOB: September 23, 1985 DOA: 06/24/2022 DOS: the patient was seen and examined on 06/24/2022 PCP: [facility]  Patient coming from: Home  Chief Complaint:  Chief Complaint  Patient presents with   Shortness of Breath   HPI: Arthur Barnes is a 36 y.o. male with medical history significant of HTN, uncontrolled T2DM and OSA who presents to the ED with c/o leg swelling and SOB.   Arthur Barnes states that for approximately 1 week, he has been experiencing worsening dyspnea on exertion and bilateral lower extremity swelling.  Approximately 2 days ago, he began to develop shortness of breath that is exacerbated by bending over and speaking.  Then yesterday, he developed substernal chest pain that self resolved.  Chest pain has not recurred since.  He endorses a longtime history of orthopnea that is relieved by wearing his CPAP; this is unchanged in nature.  He denies any recent sick contacts; he denies any fever, chills, rhinorrhea, congestion, wheezing, nausea, vomiting, diarrhea.  He notes a cough that has been going on for weeks and is unchanged in nature.  He denies any prior history of known heart failure but notes he is not taking any medication for his hypertension or diabetes.  He is inconsistent in wearing his CPAP.  He believes he has a family history of heart disease but is uncertain if there is any family history of heart failure.   Arthur Barnes states he has a history of asthma but does not use any albuterol as it has been  historically mild in nature.   Arthur Barnes states that he previously used to smoke cigarettes and vape but has not done so recently.  He also states he drinks 3-4 alcoholic beverages per day with additional beverages up to 7-9 on the weekends.  He denies any drug use including cocaine or methamphetamine.  Patient states his aunt has a history of thyroid disease, but he does not have a any personal history of  thyroid disease.  He is not taking any multivitamin or biotin products.  ED Course:  On arrival to the ED, patient was hypertensive at 175/135 with heart rate of 155.  He was saturating at 95% on room air prior to being placed on 2 L nasal cannula of supplemental oxygen for work of breathing.  EKG was obtained that showed sinus tachycardia up to 155.  Given hypervolemia on examination, he was given one-time dose of Lasix.  TRH contacted for admission.  Cardiology consulted.   Review of Systems: As mentioned in the history of present illness. All other systems reviewed and are negative. Past Medical History:  Diagnosis Date   Asthma    Hypertension    History reviewed. No pertinent surgical history.  Social History:  reports that he has been smoking. He has never used smokeless tobacco. He reports current alcohol use. He reports that he does not use drugs.  No Known Allergies  Family History  Problem Relation Age of Onset   Hypertension Mother    Thyroid disease Maternal Aunt    Prior to Admission medications   Not on File   Physical Exam: Vitals:   06/24/22 1650 06/24/22 1653 06/24/22 1700 06/24/22 1730  BP:  120/80 (!) 142/80 (!) 133/94  Pulse:  (!) 153 (!) 152 (!) 153  Resp:  (!) 22 (!) 24 (!) 21  Temp: 98.4 F (36.9 C)     TempSrc: Oral     SpO2:  100% 92% 91%  Weight:      Height:       Physical Exam Vitals and nursing note reviewed.  Constitutional:      Appearance: He is obese. He is ill-appearing.  HENT:     Head: Normocephalic and atraumatic.     Mouth/Throat:     Mouth: Mucous membranes are dry.     Pharynx: Oropharynx is clear.  Eyes:     Extraocular Movements: Extraocular movements intact.     Pupils: Pupils are equal, round, and reactive to light.  Cardiovascular:     Rate and Rhythm: Regular rhythm. Tachycardia present.     Heart sounds: No murmur heard.    Gallop (S3) present.     Comments: No sacral edema.  Pulmonary:     Effort: Tachypnea,  accessory muscle usage and respiratory distress present.     Breath sounds: Decreased breath sounds (throughout) present. No wheezing, rhonchi or rales.  Abdominal:     General: Abdomen is protuberant. Bowel sounds are normal.     Palpations: Abdomen is soft.  Musculoskeletal:     Cervical back: Neck supple.     Right lower leg: 2+ Edema (up to the knee) present.     Left lower leg: 2+ Edema (up to the knee) present.  Skin:    General: Skin is warm and dry.  Neurological:     General: No focal deficit present.     Mental Status: He is oriented to person, place, and time and easily aroused. He is lethargic.  Psychiatric:        Mood and Affect: Mood normal.        Behavior: Behavior normal. Behavior is cooperative.    Data Reviewed:  Lab Results  Component Value Date   WBC 8.3 06/24/2022   HGB 12.7 (L) 06/24/2022   HCT 39.8 06/24/2022   MCV 90.9 06/24/2022   PLT 324 06/24/2022   Lab Results  Component Value Date   NA 139 06/24/2022   NA 137 06/24/2022   K 3.8 06/24/2022   K 3.8 06/24/2022   CO2 25 06/24/2022   CO2 26 06/24/2022   GLUCOSE 100 (H) 06/24/2022   GLUCOSE 96 06/24/2022   BUN 7 06/24/2022   BUN 7 06/24/2022   CREATININE 0.92 06/24/2022   CREATININE 0.94 06/24/2022   CALCIUM 9.0 06/24/2022   CALCIUM 8.7 (L) 06/24/2022   GFRNONAA >60 06/24/2022   GFRNONAA >60 06/24/2022   Lab Results  Component Value Date   TSH <0.010 (L) 06/24/2022   Free T4: 1.34 Initial troponin elevated at 2906, with subsequent troponin elevated 2727. BNP within normal limits at 90  Lab Results  Component Value Date   CHOL 113 06/24/2022   HDL 35 (L) 06/24/2022   LDLCALC 64 06/24/2022   TRIG 71 06/24/2022   CHOLHDL 3.2 06/24/2022   EKG personally reviewed and remarkable for with narrow complex tachycardia with heart rate of 155 with right bundle branch block. Chest x-ray personally reviewed and remarkable for cardiomegaly with left pleural effusion, and evidence of  interstitial edema.  Results are pending, will review when available.  Assessment and Plan: * NSTEMI (non-ST elevated myocardial infarction) [facility]) Patient presenting with elevated troponins on admission in the setting of severe tachycardia.  He did have 1 episode of chest pain but is currently not experiencing any at this time.  Heparin drip initiated by cardiology and will likely need heart catheterization prior to discharge.  Troponins have peaked at 2900 and are  downtrending at this time.  LDL within goal at this time  - Heparin infusion per cardiology - Aspirin 81 mg daily - Formal echocardiogram pending improvement in tachycardia  Thyroid storm Patient presenting with severe sinus tachycardia, acute heart failure and NSTEMI.  Initial work-up revealed undetectable TSH at less than 0.01 and elevated free T4 at 1.34.  This is concerning for evidence of thyroid storm.  Discussed with cardiology and PCCM.  - Admit to stepdown unit - PCCM and cardiology following; appreciate their recommendations - Start methimazole 20 mg every 4 hours - Start hydrocortisone 100 mg every 8 hours - Will need evaluation for underlying etiology once stabilized  Acute heart failure [facility]) Patient presenting with 1 week symptoms of dyspnea on exertion, lower extremity swelling, increased shortness of breath and chest pain.  Hypervolemic on examination with bedside echocardiogram consistent with heart failure.  I suspect BNP is normal given morbid obesity.  - Cardiology consulted; appreciate their recommendations - Start Lasix 40 mg twice daily - Monitor magnesium potassium daily while on diuretics - Formal echocardiogram pending improvement in heart rate - Initiate guideline directed medical therapy prior to discharge - Strict in and outs - Daily weights  Narrow complex tachycardia Patient presenting with narrow complex tachycardia with right bundle branch block with rates persistently in the 150s.  6 mg of  adenosine and then 12 mg of adenosine without any improvement in tachycardia.  Diltiazem infusion started and titrated to maximum dose without improvement.  Given results demonstrating thyroid storm, will transition to beta-blocker.  - Metoprolol 5 mg IV injection every 4 hours as needed - Metoprolol p.o. 25 mg every 6 hours  Type 2 diabetes mellitus (HCC) Patient states he has a history of type 2 diabetes and was previously insulin requiring.  He is discontinued his own insulin due to difficulty with maintaining regimen.  He is no longer taking any antiglycemic's.  -SSI, resistant scale - A1c pending  Hypertension Patient states he has a past medical history of hypertension and does not currently take antihypertensives.  We will need to start a stable regimen prior to discharge  OSA (obstructive sleep apnea) - CPAP at night while admitted - Encouraged improved compliance at home postdischarge  Asthma - Albuterol as needed for wheezing  Alcohol use Patient states he is drinking at least 3-4 alcohol beverages per day 5 days out of the week and then 7-9 beverages per day on the other 2 days.  Although patient is certainly misusing alcohol, no evidence of abuse at this time.  -Start daily folic acid and thiamine   Advance Care Planning:   Code Status: Full Code   Consults: Cardiology. PCCM  Family Communication: Sister and mother updated at bedside.   Severity of Illness: The appropriate patient status for this patient is INPATIENT. Inpatient status is judged to be reasonable and necessary in order to provide the required intensity of service to ensure the patient's safety. The patient's presenting symptoms, physical exam findings, and initial radiographic and laboratory data in the context of their chronic comorbidities is felt to place them at high risk for further clinical deterioration. Furthermore, it is not anticipated that the patient will be medically stable for discharge from  the hospital within 2 midnights of admission.   * I certify that at the point of admission it is my clinical judgment that the patient will require inpatient hospital care spanning beyond 2 midnights from the point of admission due to high intensity of service, high risk  for further deterioration and high frequency of surveillance required.*   Author: Jose Persia, MD 06/24/2022 6:22 PM  For on call review [email or web address].

## 2022-06-24 NOTE — ED Notes (Signed)
Critical value troponin 2,906. Cheri Fowler, MD aware.

## 2022-06-24 NOTE — ED Notes (Signed)
Pt taken to ED 18 at this time

## 2022-06-24 NOTE — Assessment & Plan Note (Signed)
Patient presenting with 1 week symptoms of dyspnea on exertion, lower extremity swelling, increased shortness of breath and chest pain.  Hypervolemic on examination with bedside echocardiogram consistent with heart failure.  I suspect BNP is normal given morbid obesity.  - Cardiology consulted; appreciate their recommendations - Start Lasix 40 mg twice daily - Monitor magnesium potassium daily while on diuretics - Formal echocardiogram pending improvement in heart rate - Initiate guideline directed medical therapy prior to discharge - Strict in and outs - Daily weights

## 2022-06-24 NOTE — ED Notes (Signed)
Pt placed on 2L O2 for comfort.  

## 2022-06-24 NOTE — ED Notes (Signed)
Message sent to Charleen Kirks, MD and Fletcher Anon, MD regarding pt's HR of 153 after IV and PO metoprolol.

## 2022-06-24 NOTE — Assessment & Plan Note (Signed)
Patient states he has a history of type 2 diabetes and was previously insulin requiring.  He is discontinued his own insulin due to difficulty with maintaining regimen.  He is no longer taking any antiglycemic's.  -SSI, resistant scale - A1c pending

## 2022-06-25 ENCOUNTER — Encounter: Payer: Self-pay | Admitting: Internal Medicine

## 2022-06-25 ENCOUNTER — Inpatient Hospital Stay (HOSPITAL_COMMUNITY)
Admit: 2022-06-25 | Discharge: 2022-06-25 | Disposition: A | Discharge status: Home / Self Care | Payer: Commercial Managed Care - HMO | Attending: Physician Assistant | Admitting: Physician Assistant

## 2022-06-25 ENCOUNTER — Inpatient Hospital Stay: Payer: Commercial Managed Care - HMO

## 2022-06-25 DIAGNOSIS — I5021 Acute systolic (congestive) heart failure: Secondary | ICD-10-CM

## 2022-06-25 DIAGNOSIS — I4892 Unspecified atrial flutter: Secondary | ICD-10-CM | POA: Diagnosis not present

## 2022-06-25 DIAGNOSIS — I214 Non-ST elevation (NSTEMI) myocardial infarction: Secondary | ICD-10-CM | POA: Diagnosis not present

## 2022-06-25 DIAGNOSIS — E0591 Thyrotoxicosis, unspecified with thyrotoxic crisis or storm: Secondary | ICD-10-CM | POA: Diagnosis not present

## 2022-06-25 LAB — ECHOCARDIOGRAM COMPLETE
AR max vel: 2.97 cm2
AV Area VTI: 2.97 cm2
AV Area mean vel: 2.44 cm2
AV Mean grad: 3 mmHg
AV Peak grad: 4.5 mmHg
Ao pk vel: 1.06 m/s
Area-P 1/2: 12.04 cm2
Height: 66 in
S' Lateral: 3.6 cm
Weight: 4183.45 oz

## 2022-06-25 LAB — COMPREHENSIVE METABOLIC PANEL
ALT: 31 U/L (ref 0–44)
AST: 24 U/L (ref 15–41)
Albumin: 3.4 g/dL — ABNORMAL LOW (ref 3.5–5.0)
Alkaline Phosphatase: 65 U/L (ref 38–126)
Anion gap: 8 (ref 5–15)
BUN: 19 mg/dL (ref 6–20)
CO2: 25 mmol/L (ref 22–32)
Calcium: 9.1 mg/dL (ref 8.9–10.3)
Chloride: 101 mmol/L (ref 98–111)
Creatinine, Ser: 1.18 mg/dL (ref 0.61–1.24)
GFR, Estimated: >60 mL/min (ref >60)
Glucose, Bld: 148 mg/dL — ABNORMAL HIGH (ref 70–99)
Potassium: 4.5 mmol/L (ref 3.5–5.1)
Sodium: 134 mmol/L — ABNORMAL LOW (ref 135–145)
Total Bilirubin: 1 mg/dL (ref 0.3–1.2)
Total Protein: 7.8 g/dL (ref 6.5–8.1)

## 2022-06-25 LAB — HEPARIN LEVEL (UNFRACTIONATED)
Heparin Unfractionated: 0.66 IU/mL (ref 0.30–0.70)
Heparin Unfractionated: 0.54 IU/mL (ref 0.30–0.70)

## 2022-06-25 LAB — GLUCOSE, CAPILLARY
Glucose-Capillary: 133 mg/dL — ABNORMAL HIGH (ref 70–99)
Glucose-Capillary: 126 mg/dL — ABNORMAL HIGH (ref 70–99)
Glucose-Capillary: 147 mg/dL — ABNORMAL HIGH (ref 70–99)
Glucose-Capillary: 148 mg/dL — ABNORMAL HIGH (ref 70–99)
Glucose-Capillary: 174 mg/dL — ABNORMAL HIGH (ref 70–99)

## 2022-06-25 LAB — PHOSPHORUS: Phosphorus: 4.4 mg/dL (ref 2.5–4.6)

## 2022-06-25 LAB — CBC
HCT: 39.1 % (ref 39.0–52.0)
Hemoglobin: 12.6 g/dL — ABNORMAL LOW (ref 13.0–17.0)
MCH: 29.2 pg (ref 26.0–34.0)
MCHC: 32.2 g/dL (ref 30.0–36.0)
MCV: 90.5 fL (ref 80.0–100.0)
Platelets: 341 10*3/uL (ref 150–400)
RBC: 4.32 MIL/uL (ref 4.22–5.81)
RDW: 14.2 % (ref 11.5–15.5)
WBC: 9.8 10*3/uL (ref 4.0–10.5)
nRBC: 0 % (ref 0.0–0.2)

## 2022-06-25 LAB — MAGNESIUM: Magnesium: 2 mg/dL (ref 1.7–2.4)

## 2022-06-25 LAB — MRSA NEXT GEN BY PCR, NASAL: MRSA by PCR Next Gen: NOT DETECTED

## 2022-06-25 LAB — PROCALCITONIN: Procalcitonin: <0.1 ng/mL

## 2022-06-25 LAB — T3: T3, Total: 146 ng/dL (ref 71–180)

## 2022-06-25 MED ORDER — LORAZEPAM 2 MG/ML IJ SOLN
1.0000 mg | INTRAMUSCULAR | Status: DC | PRN
  Administered 2022-06-25: 1 mg via INTRAVENOUS
  Filled 2022-06-25: qty 1

## 2022-06-25 MED ORDER — POTASSIUM IODIDE (EXPECTORANT) 1 GM/ML PO SOLN: 150.0000 mg | Freq: Three times a day (TID) | ORAL | Status: DC

## 2022-06-25 MED ORDER — LORAZEPAM 2 MG PO TABS: 0.0000 mg | ORAL_TABLET | ORAL | Status: DC

## 2022-06-25 MED ORDER — THIAMINE MONONITRATE 100 MG PO TABS: 100.0000 mg | ORAL_TABLET | Freq: Every day | ORAL | Status: DC

## 2022-06-25 MED ORDER — LORAZEPAM 2 MG PO TABS: 0.0000 mg | ORAL_TABLET | Freq: Three times a day (TID) | ORAL | Status: DC

## 2022-06-25 MED ORDER — METOPROLOL TARTRATE 50 MG PO TABS: 50.0000 mg | ORAL_TABLET | Freq: Four times a day (QID) | ORAL | Status: DC

## 2022-06-25 MED ORDER — LORAZEPAM 1 MG PO TABS: 1.0000 mg | ORAL_TABLET | ORAL | Status: DC | PRN

## 2022-06-25 MED ORDER — THIAMINE MONONITRATE 100 MG PO TABS
100.0000 mg | ORAL_TABLET | Freq: Every day | ORAL | Status: DC
  Administered 2022-06-26 – 2022-06-30 (×5): 100 mg via ORAL
  Filled 2022-06-25 (×5): qty 1

## 2022-06-25 MED ORDER — LORAZEPAM 2 MG/ML IJ SOLN
1.0000 mg | INTRAMUSCULAR | Status: DC
  Administered 2022-06-25 – 2022-06-26 (×5): 1 mg via INTRAVENOUS
  Filled 2022-06-25 (×5): qty 1

## 2022-06-25 MED ORDER — ORAL CARE MOUTH RINSE: 15.0000 mL | OROMUCOSAL | Status: DC | PRN

## 2022-06-25 MED ORDER — POTASSIUM IODIDE (EXPECTORANT) 1 GM/ML PO SOLN
150.0000 mg | Freq: Four times a day (QID) | ORAL | Status: DC
  Filled 2022-06-25 (×3): qty 0.15

## 2022-06-25 MED ORDER — ADULT MULTIVITAMIN W/MINERALS CH: 1.0000 | ORAL_TABLET | Freq: Every day | ORAL | Status: DC

## 2022-06-25 MED ORDER — ATENOLOL 25 MG PO TABS
25.0000 mg | ORAL_TABLET | Freq: Every day | ORAL | Status: DC
  Administered 2022-06-25: 25 mg via ORAL
  Filled 2022-06-25: qty 1

## 2022-06-25 MED ORDER — PROPYLTHIOURACIL 50 MG PO TABS
50.0000 mg | ORAL_TABLET | Freq: Three times a day (TID) | ORAL | Status: DC
  Administered 2022-06-25 – 2022-06-28 (×9): 50 mg via ORAL
  Filled 2022-06-25 (×9): qty 1

## 2022-06-25 MED ORDER — CHLORHEXIDINE GLUCONATE CLOTH 2 % EX PADS
6.0000 | MEDICATED_PAD | Freq: Every day | CUTANEOUS | Status: DC
  Administered 2022-06-25 – 2022-06-29 (×4): 6 via TOPICAL
  Filled 2022-06-25: qty 6

## 2022-06-25 MED ORDER — FOLIC ACID 1 MG PO TABS: 1.0000 mg | ORAL_TABLET | Freq: Every day | ORAL | Status: DC

## 2022-06-25 MED ORDER — DIGOXIN 0.25 MG/ML IJ SOLN
0.2500 mg | INTRAMUSCULAR | Status: AC
  Administered 2022-06-25: 0.25 mg via INTRAVENOUS
  Filled 2022-06-25: qty 2

## 2022-06-25 MED ORDER — THIAMINE HCL 100 MG/ML IJ SOLN: 100.0000 mg | Freq: Every day | INTRAMUSCULAR | Status: DC

## 2022-06-25 MED ORDER — PERFLUTREN LIPID MICROSPHERE
1.0000 mL | INTRAVENOUS | Status: AC | PRN
  Administered 2022-06-25: 3 mL via INTRAVENOUS

## 2022-06-25 MED ORDER — ATENOLOL 25 MG PO TABS
25.0000 mg | ORAL_TABLET | Freq: Two times a day (BID) | ORAL | Status: DC
  Administered 2022-06-25: 25 mg via ORAL
  Filled 2022-06-25: qty 1

## 2022-06-25 MED ORDER — CHOLESTYRAMINE LIGHT 4 G PO PACK
4.0000 g | PACK | Freq: Three times a day (TID) | ORAL | Status: DC
  Administered 2022-06-25 – 2022-06-30 (×16): 4 g via ORAL
  Filled 2022-06-25 (×17): qty 1

## 2022-06-25 NOTE — Progress Notes (Signed)
Progress Note  Patient Name: Arthur Barnes Date of Encounter: 06/25/2022  Primary Cardiologist: new - consult by Fletcher Anon  Subjective   He remains in atrial flutter with RVR with variable AV block in the context of hyperthyroidism.  Orthopnea and lower extremity swelling persist.  Dyspnea is improving.  No palpitations or chest pain.  Blood pressure improved on diltiazem drip in the 120s to low 782N systolic.  Inpatient Medications    Scheduled Meds:  Chlorhexidine Gluconate Cloth  6 each Topical Q0600   cholestyramine light  4 g Oral TID   folic acid  1 mg Oral Daily   furosemide  40 mg Intravenous Q12H   hydrocortisone sod succinate (SOLU-CORTEF) inj  100 mg Intravenous Q8H   insulin aspart  0-20 Units Subcutaneous TID WC   LORazepam  1 mg Intravenous Q3H   metoprolol tartrate  50 mg Oral Q6H   multivitamin with minerals  1 tablet Oral Daily   propylthiouracil  50 mg Oral Q8H   sodium chloride flush  3 mL Intravenous Q12H   [START ON 06/26/2022] thiamine  100 mg Oral Daily   Or   [START ON 06/26/2022] thiamine  100 mg Intravenous Daily   Continuous Infusions:  diltiazem (CARDIZEM) infusion 10 mg/hr (06/25/22 0600)   heparin 1,450 Units/hr (06/25/22 0600)   PRN Meds: acetaminophen **OR** acetaminophen, albuterol, LORazepam **OR** LORazepam, metoprolol tartrate, mouth rinse, polyethylene glycol   Vital Signs    Vitals:   06/25/22 0500 06/25/22 0600 06/25/22 0755 06/25/22 0900  BP: 95/60 119/81 109/71 119/68  Pulse: 72 73  (!) 140  Resp: (!) 27 (!) 26    Temp:  98.2 F (36.8 C)    TempSrc:      SpO2: 91% 91%    Weight: 118.6 kg     Height:        Intake/Output Summary (Last 24 hours) at 06/25/2022 1022 Last data filed at 06/25/2022 0600 Gross per 24 hour  Intake 449.12 ml  Output 1200 ml  Net -750.88 ml   Filed Weights   06/24/22 1423 06/25/22 0500  Weight: 133.8 kg 118.6 kg    Telemetry    Atrial flutter with variable AV block, 1 teens bpm -  Personally Reviewed  ECG    No new tracings - Personally Reviewed  Physical Exam   GEN: No acute distress.   Neck: JVD difficult to assess secondary to body habitus. Cardiac: Tachycardic and irregular, no murmurs, rubs, or gallops.  Respiratory: Diminished breath sounds along the bilateral bases.  GI: Soft, nontender, mildly distended.   MS: 1+ bilateral lower extremity pitting edema; No deformity. Neuro:  Alert and oriented x 3; Nonfocal.  Psych: Normal affect.  Labs    Chemistry Recent Labs  Lab 06/24/22 1150 06/25/22 0516  NA 137  139 134*  K 3.8  3.8 4.5  CL 102  102 101  CO2 26  25 25   GLUCOSE 96  100* 148*  BUN 7  7 19   CREATININE 0.94  0.92 1.18  CALCIUM 8.7*  9.0 9.1  PROT 7.3 7.8  ALBUMIN 3.4* 3.4*  AST 29 24  ALT 35 31  ALKPHOS 67 65  BILITOT 0.8 1.0  GFRNONAA >60  >60 >60  ANIONGAP 9  12 8      Hematology Recent Labs  Lab 06/24/22 1150 06/25/22 0516  WBC 8.3 9.8  RBC 4.38 4.32  HGB 12.7* 12.6*  HCT 39.8 39.1  MCV 90.9 90.5  MCH 29.0 29.2  MCHC  31.9 32.2  RDW 14.4 14.2  PLT 324 341    Cardiac EnzymesNo results for input(s): "TROPONINI" in the last 168 hours. No results for input(s): "TROPIPOC" in the last 168 hours.   BNP Recent Labs  Lab 06/24/22 1150  BNP 91.0     DDimer No results for input(s): "DDIMER" in the last 168 hours.   Radiology    DG Chest 2 View  Result Date: 06/24/2022 IMPRESSION: 1. Cardiomegaly, questionably enlarged compared to the previous exams. Pericardial effusion can not be excluded. 2. Probable CHF/volume overload. Electronically Signed   By: Bary Richard M.D.   On: 06/24/2022 12:28    Cardiac Studies   2D echo pending  Patient Profile     36 y.o. male with history of HTN, asthma, obesity, and OSA on CPAP who is being seen today for the evaluation of new onset CHF at the request of Dr. Vicente Males.  Assessment & Plan    1.  Newly diagnosed atrial flutter with variable AV block and  RVR: -Presented with a narrow complex tachycardia with a rate of 155 bpm that persisted in the ED -IV adenosine pushed in the ED at 6 mg and 12 mg without breaking or slowing of rhythm/rate respectively (pacer pads in place) -With initiation of Cardizem drip, we were able to slow his rate enough to see flutter waves -Atrial tachyarrhythmia is in the setting of hyperthyroidism with an undetectable TSH and elevated free T4 -Hemodynamically stable -Titrate metoprolol to 50 mg every 6 hours -Continue Cardizem drip -Heparin gtt -Once his thyroid is under better control, if he remains in atrial flutter, would look to pursue TEE guided DCCV.  Not pursuing cardioversion at this time given low likelihood of maintaining sinus rhythm in the context of acute hyperthyroidism -IV amiodarone is contraindicated in the context of acute hyperthyroidism -Potassium repleted  2. Acute HFrEF: -Preliminary report on echo with a mildly reduced EF -Likely tachycardia mediated, though cannot exclude ischemic heart disease -Continue IV Lasix to 40 mg bid  -For now, continue Lopressor as outlined above with recommendation to consolidate to Toprol-XL or carvedilol prior to discharge, pending heart rates, given cardiomyopathy -Escalate GDMT as able, relative hypotension precludes escalation at this time -Daily weights -Strict I/O   3.  Elevated high-sensitivity troponin: -Currently, without chest pain -Possibly secondary to supply/demand mismatch ischemia in the context of atrial flutter with RVR, though cannot exclude underlying CAD -Initial and peak high sensitivity troponin 2906 -Echo pending -Continue heparin gtt -ASA -Will need R/LHC prior to discharge once his ventricular rates are improved and he has undergone adequate diuresis  -LDL 64    4. OSA: -CPAP  5.  Alcohol use: -CIWA protocol  6.  Hyperthyroidism: -Likely driving etiology of his atrial flutter with RVR -Started on PTU -Management per  primary service     For questions or updates, please contact [facility] Please consult [email or web address] for contact info under Cardiology/STEMI.    Signed, Eula Listen, PA-C [facility] Pager: [phone] 06/25/2022, 10:22 AM

## 2022-06-25 NOTE — Progress Notes (Signed)
*  PRELIMINARY RESULTS* Echocardiogram 2D Echocardiogram has been performed.  Sherrie Sport 06/25/2022, 8:33 AM

## 2022-06-25 NOTE — Progress Notes (Signed)
Pts HR in beginning of shift was 80's Afib while sleeping, when pt woke up pts HR climbed up to the mid 140's ST, notified MD, Cardiology, ICU MD as well as gave prn labetalol and went up on Cardizem gtt from 10 to 15. Started on CIWA protocol as well as gave prn 1mg  ativan. Stat ekg done. Only new orders that were given were for steroids and thyroid, nothing new for HR. Pt has continued to stay ST 140's asymptomatic, Aox4. MD notified that ativan doing nothing for HR and pt scoring 0's on ciwa protocol, per MD dc'd CIWA and ativan protocol. Per ICU MD who notified hospitalist that per his conversation with endocrinologist pt is probably going to be in 140's ST for the next 2-3 weeks. Pt made aware and will continue to monitor.

## 2022-06-25 NOTE — TOC Initial Note (Signed)
Transition of Care [facility]) - Initial/Assessment Note    Patient Details  Name: Arthur Barnes MRN: [id number] Date of Birth: [birth year]  [facility] CM/SW Contact:    Colen Darling, [place] Phone Number: 06/25/2022, 4:23 PM  Clinical Narrative:                  SW met with the patient at bedside. He was unable to provide extensive information. Patient is current with his PCP Dr. Lalla Brothers. He drives himself to appointments. His pharmacy is located at [address], [place], [place]. He states he has oxygen at home. He states he has not had home health recently. Family contacts include his mother and sister.        Patient Goals and CMS Choice Patient states their goals for this hospitalization and ongoing recovery are:: SW to follow up with the patient and family      Expected Discharge Plan and Services          Pending rehab evaluations                                      Prior Living Arrangements/Services   Lives with:: Self Patient language and need for interpreter reviewed:: No          Care giver support system in place?: Yes (comment) Current home services: DME (CPAP)    Activities of Daily Living Home Assistive Devices/Equipment: CPAP, Oxygen ADL Screening (condition at time of admission) Patient's cognitive ability adequate to safely complete daily activities?: Yes Is the patient deaf or have difficulty hearing?: No Does the patient have difficulty seeing, even when wearing glasses/contacts?: No Does the patient have difficulty concentrating, remembering, or making decisions?: No Patient able to express need for assistance with ADLs?: Yes Does the patient have difficulty dressing or bathing?: No Independently performs ADLs?: Yes (appropriate for developmental age) Does the patient have difficulty walking or climbing stairs?: No Weakness of Legs: None Weakness of Arms/Hands: None  Permission Sought/Granted Permission  sought to share information with : Family Supports                Emotional Assessment Appearance:: Appears stated age Attitude/Demeanor/Rapport: Unable to Assess   Orientation: : Oriented to Self, Oriented to Place, Oriented to  Time, Oriented to Situation Alcohol / Substance Use: Alcohol Use, Other (comment) (former smoker)    Admission diagnosis:  Acute pulmonary edema ([place]) [J81.0] NSTEMI (non-ST elevated myocardial infarction) ([place]) [I21.4] Acute respiratory failure with hypoxia ([place]) [J96.01] Patient Active Problem List   Diagnosis Date Noted   NSTEMI (non-ST elevated myocardial infarction) ([place]) 06/24/2022   Thyroid storm 06/24/2022   Acute heart failure ([place]) 06/24/2022   Type 2 diabetes mellitus ([place]) 06/24/2022   Narrow complex tachycardia 06/24/2022   OSA (obstructive sleep apnea) 06/24/2022   Hypertension    Asthma    Chest pain    Elevated troponin    Tobacco abuse    Alcohol use    PCP:  Physicians, [place]:   [id number] [address], [place] [address] [place] [address] Phone: [phone] Fax: [phone]     Social Determinants of Health (SDOH) Interventions    Readmission Risk Interventions     No data to display

## 2022-06-25 NOTE — Consult Note (Signed)
NAME:  Arthur Barnes, MRN:  [id number], DOB:  06-16-1986, LOS: 1 ADMISSION DATE:  06/24/2022, CONSULTATION DATE: 06/24/2022 REFERRING MD:  Revonda Humphrey, CHIEF COMPLAINT: Shortness of breath   HPI  36 y.o male with significant PMH of OSA on CPAP,T2DM, asthma, HTN, prior tobacco abuse, and EtOH abuse who presented to the ED with chief complaints of shortness of breath, extremity edema, abdominal distention and orthopnea x1 week.   ED Course: Initial vital signs showed HR of 155 beats/minute, BP 175/135 mm Hg, the RR 18 breaths/minute, and the oxygen saturation 98% on RA and a temperature of 98.F (36.7C).   Pertinent Labs/Diagnostics Findings: Chemistry: Unremarkable CBC: Unremarkable Other Lab findings: TSH <0.010, Free T3 pending, Free T4  1.34, Lactic acid: 1.2 COVID PCR: Negative, Troponin: 2906, BNP: 91 Imaging:  CXR> cardiomegaly questionable pericardial effusion probable CHF/volume overload  EKG shows narrow complex tachycardia, patient received 6 mg of IV adenosine push, second dose 12 mg IV adenosine push without improvement.  Cardiology was consulted who reviewed EKG and thought to be consistent with atrial flutter with 2-1 AV block with RBBB likely in the setting of uncontrolled hyperthyroidism.  Patient also received IV Lasix x1 dose and IV diltiazem without improvement therefore was started on oral and IV metoprolol by cardiology.  He was also started on heparin drip for elevated troponin.  Patient admitted to hospitalist service.  Due to persistent tachycardia with high risk for decompensation, PCCM was consulted.  Past Medical History  OSA on CPAP,T2DM, asthma, HTN, prior tobacco abuse, and EtOH abuse   Significant Hospital Events   10/9: Admitted to stepdown with new onset Afib/Aflutter in the setting of uncontrolled hyperthyroidism and probable new onset CHF. PCCM consulted.  Consults:  Cardiology PCCM  Procedures:  None  Significant Diagnostic Tests:  10/9:  Chest Xray>1. Cardiomegaly, questionably enlarged compared to the previous exams. Pericardial effusion can not be excluded. 2. Probable CHF/volume overload.   Micro Data:  10/9: SARS-CoV-2 PCR> negative 10/9: Influenza PCR> negative 10/9: Blood culture x2> 10/9: Urine Culture> 10/9: MRSA PCR>>   Antimicrobials:  None  OBJECTIVE  Blood pressure 109/71, pulse 73, temperature 98.2 F (36.8 C), resp. rate (!) 26, height 5\' 6"  (1.676 m), weight 118.6 kg, SpO2 91 %.        Intake/Output Summary (Last 24 hours) at 06/25/2022 0853 Last data filed at 06/25/2022 0600 Gross per 24 hour  Intake 449.12 ml  Output 1200 ml  Net -750.88 ml    Filed Weights   06/24/22 1423 06/25/22 0500  Weight: 133.8 kg 118.6 kg    Physical Examination  GENERAL: 36 year-old critically ill patient lying in the bed with no acute distress.  EYES: Pupils equal, round, reactive to light and accommodation. No scleral icterus. Extraocular muscles intact.  HEENT: Head atraumatic, normocephalic. Oropharynx and nasopharynx clear.  NECK:  Supple, no jugular venous distention. No thyroid enlargement, no tenderness.  LUNGS: Decreased breath sounds bilaterally, no wheezing, rales,rhonchi or crepitation. Mild use of accessory muscles of respiration.  CARDIOVASCULAR: Irregular. No murmurs, rubs, or gallops.  ABDOMEN: Soft, nontender, nondistended. Bowel sounds present. No organomegaly or mass.  EXTREMITIES: Bilateral lower extremities edema 2+ edema,  cyanosis, or clubbing.  NEUROLOGIC: Cranial nerves II through XII are intact.  Muscle strength 5/5 in all extremities. Sensation intact. Gait not checked.  PSYCHIATRIC: The patient is alert and oriented x 3.  SKIN: No obvious rash, lesion, or ulcer.   Labs/imaging that I havepersonally reviewed  (right click and "  Reselect all SmartList Selections" daily)     Labs   CBC: Recent Labs  Lab 06/24/22 1150 06/25/22 0516  WBC 8.3 9.8  HGB 12.7* 12.6*  HCT 39.8 39.1   MCV 90.9 90.5  PLT 324 341     Basic Metabolic Panel: Recent Labs  Lab 06/24/22 1150 06/24/22 1506 06/25/22 0516  NA 137  139  --  134*  K 3.8  3.8  --  4.5  CL 102  102  --  101  CO2 26  25  --  25  GLUCOSE 96  100*  --  148*  BUN 7  7  --  19  CREATININE 0.94  0.92  --  1.18  CALCIUM 8.7*  9.0  --  9.1  MG  --  2.0 2.0  PHOS  --   --  4.4    GFR: Estimated Creatinine Clearance: 104.9 mL/min (by C-G formula based on SCr of 1.18 mg/dL). Recent Labs  Lab 06/24/22 1150 06/24/22 1950 06/25/22 0516  PROCALCITON  --  <0.10 <0.10  WBC 8.3  --  9.8  LATICACIDVEN  --  1.2  --      Liver Function Tests: Recent Labs  Lab 06/24/22 1150 06/25/22 0516  AST 29 24  ALT 35 31  ALKPHOS 67 65  BILITOT 0.8 1.0  PROT 7.3 7.8  ALBUMIN 3.4* 3.4*    No results for input(s): "LIPASE", "AMYLASE" in the last 168 hours. No results for input(s): "AMMONIA" in the last 168 hours.  ABG No results found for: "PHART", "PCO2ART", "PO2ART", "HCO3", "TCO2", "ACIDBASEDEF", "O2SAT"   Coagulation Profile: Recent Labs  Lab 06/24/22 1548  INR 1.2     Cardiac Enzymes: No results for input(s): "CKTOTAL", "CKMB", "CKMBINDEX", "TROPONINI" in the last 168 hours.  HbA1C: Hgb A1c MFr Bld  Date/Time Value Ref Range Status  06/24/2022 03:06 PM 6.5 (H) 4.8 - 5.6 % Final    Comment:    (NOTE) Pre diabetes:          5.7%-6.4%  Diabetes:              >6.4%  Glycemic control for   <7.0% adults with diabetes     CBG: Recent Labs  Lab 06/24/22 1146 06/24/22 1614 06/24/22 2308 06/25/22 0553 06/25/22 0741  GLUCAP 105* 141* 148* 133* 126*     Review of Systems:   10 point ROS done and is negative except as per HPI Past Medical History  He,  has a past medical history of Asthma and Hypertension.   Surgical History   History reviewed. No pertinent surgical history.   Social History   reports that he has been smoking. He has never used smokeless tobacco. He reports  current alcohol use. He reports that he does not use drugs.   Family History   His family history includes Hypertension in his mother; Thyroid disease in his maternal aunt.   Allergies No Known Allergies   Home Medications  Prior to Admission medications   Not on File  Scheduled Meds:  Chlorhexidine Gluconate Cloth  6 each Topical 99991111   folic acid  1 mg Oral Daily   furosemide  40 mg Intravenous Q12H   hydrocortisone sod succinate (SOLU-CORTEF) inj  100 mg Intravenous Q8H   insulin aspart  0-20 Units Subcutaneous TID WC   methimazole  20 mg Oral Q4H   metoprolol tartrate  25 mg Oral Q6H   multivitamin with minerals  1 tablet Oral Daily  sodium chloride flush  3 mL Intravenous Q12H   thiamine  100 mg Oral Daily   Continuous Infusions:  diltiazem (CARDIZEM) infusion 10 mg/hr (06/25/22 0600)   heparin 1,450 Units/hr (06/25/22 0600)   PRN Meds:.acetaminophen **OR** acetaminophen, albuterol, metoprolol tartrate, mouth rinse, polyethylene glycol     Assessment & Plan:   Thyroid Crisis TSH: <0.0, Free T4: 1.34 -Blood cultures & infectious workup as indicated -Hold abx for now although have low suspicion for infectious process -Continue Methimazole 20 mg Q4hr -Start hydrocortisone 300 mg IV x 1 dose then maintenance dose of 100 mg IV Q8hr. -Continue Metoprolol 25 mg Q6hr -Due to concerns for CHF, may require permissive tachycardia to achieve adequate perfusion with targeting a heart rate below [id number]/m  -Avoid salicylates or NSAIDs, may increase free thyroid hormone levels. -Gentle hydration with PRN Vasopressor (phenylephrine) for MAP goal>65  AFib+RVR, Likely provoked in the setting of uncontrolled hyperthyroidism Elevated Troponin likely demand ischemia in the setting of above -Serial EKGs -Trend Troponins -TTEcho -Diltiazem 0.25mg /kg (15mg ) IV x1 then maintain 5-15mg /hr drip -Metoprolol 2.5-5mg  IV up to x3 then PO maintenance -continue Heparin as above -Keep NPO  for option of TEEcho + DCCardioversion -Cardiology Consult, input appreciated  Acute Decompensated CHF PMHx: OSA on CPAP, HTN, Asthma, Prior tobacco use -Supplemental O2 as needed to maintain O2 saturations 88 to 92% -Continuous cardiac monitoring -Maintain MAP greater than 65 -IV Lasix as blood pressure and renal function permits; currently on Lasix 40 mg IV BID -Cardiology following, appreciate input -Repeat 2D Echocardiogram  EtOH Abuse -Follow CMP, INR, Daily BMP+Mg -CIWA symptom triggered PRNs -Daily Thiamine, Folate, MVI once tolerating PO -SW consult for cessation resources -PT/OT evaluation for mobility   T2 Diabetes mellitus HgbA1c 6.5 -CBG's AC & hs; Target range of 140 to 180 -SSI -Follow ICU Hypo/Hyperglycemia protocol    Best practice:  Diet:  NPO Pain/Anxiety/Delirium protocol (if indicated): Yes (RASS goal 0) VAP protocol (if indicated): Not indicated DVT prophylaxis: Systemic AC GI prophylaxis: N/A Glucose control:  SSI Yes Central venous access:  N/A Arterial line:  N/A Foley:  N/A Mobility:  bed rest  PT consulted: N/A Last date of multidisciplinary goals of care discussion [06/24/22] Code Status:  full code Disposition: ICU   = Goals of Care = Code Status Order: FULL  Primary Emergency Contact: Peden,NANCY L Wishes to pursue full aggressive treatment and intervention options, including CPR and intubation, but goals of care will be addressed on going with family if that should become necessary.  Critical care provider statement:   Total critical care time: 33 minutes   Performed by: Lanney Gins MD   Critical care time was exclusive of separately billable procedures and treating other patients.   Critical care was necessary to treat or prevent imminent or life-threatening deterioration.   Critical care was time spent personally by me on the following activities: development of treatment plan with patient and/or surrogate as well as nursing,  discussions with consultants, evaluation of patient's response to treatment, examination of patient, obtaining history from patient or surrogate, ordering and performing treatments and interventions, ordering and review of laboratory studies, ordering and review of radiographic studies, pulse oximetry and re-evaluation of patient's condition.    Ottie Glazier, M.D.  Pulmonary & Critical Care Medicine

## 2022-06-25 NOTE — Hospital Course (Addendum)
Arthur Barnes is a 36 y.o. male with medical history significant of HTN, uncontrolled T2DM and OSA who presents to the ED with c/o leg swelling and SOB x1 week, chest pain x 1 day brief episode.  10/09: On arrival to the ED, patient was hypertensive at 175/135 with heart rate of 155.  He was saturating at 95% on room air prior to being placed on 2 L nasal cannula of supplemental oxygen for work of breathing.  EKG sinus tachycardia up to 155.  CXR cardiomegaly with left pleural effusion, interstitial edema.  Given hypervolemia on examination, he was given one-time dose of Lasix.  S3 gallop, +2 edema to knees on exam.  Admitted for NSTEMI, acute heart failure, due to thyroid storm with undetectable TSH and elevated free T4 at 1.34.  Cardiology and PCCM consulted. 10/10: Changed methimazole to PTU. Significant tachycardia but unlikely to correct until underlying hyperthyroidism improves. Independently, Dr Arthur Barnes spoke w/ Dr Arthur Barnes [facility] Endocrinology, I secure chatted w/ Dr Arthur Barnes of [facility] - both specialists agree unlikely true thyroid storm, cardiac issues seem to be primary concern, unlikely patient will improve until several weeks on medicine. Remains on heparin for NSTEMI and if cardiology clears for discharge he can go on methimazole or PTU, would stop steroids and iodide, would NOT increase calcium channel blocker or beta blocker as this may have minimal effect on HR and will be likely to cause hypotension    Consultants:  Cardiology PCCM  Procedures: none      ASSESSMENT & PLAN:   Principal Problem:   NSTEMI (non-ST elevated myocardial infarction) ([place]) Active Problems:   Thyroid storm   Acute heart failure (HCC)   Narrow complex tachycardia   Type 2 diabetes mellitus (HCC)   Hypertension   OSA (obstructive sleep apnea)   Asthma   Alcohol use   Hyperthyroidism uncertain etiology - ruled out Thyroid storm Patient with severe sinus tachycardia, acute heart failure and NSTEMI.    Undetectable TSH at less than 0.01 and elevated free T4 at 1.34 but minimal elevation makes thyroid storm unlikely.   Admit to stepdown unit pending improvement in HR PCCM and cardiology following; appreciate their recommendations Start methimazole 20 mg every 4 hours --> changed to PTU per UpToDate recs  Start hydrocortisone 100 mg every 8 hours --> d/c per endocrine recs Started SSKI iodine per UpToDate recs --> d/c per endocrine recs  Added thyroid Ab, eval for Graves Thyroid US, eval for nodular goiter / possible adenoma   Acute heart failure ([place]) Complicated by hyperthyroid, Hx HTN, DM2, OSA Cardiology consulted; appreciate their recommendations Start Lasix 40 mg IV twice daily Monitor magnesium potassium daily while on diuretics Formal echocardiogram pending improvement in heart rate Initiate guideline directed medical therapy prior to discharge Strict in and outs Daily weights  NSTEMI (non-ST elevated myocardial infarction) ([place]) Complicated by hyperthyroid, Hx HTN, DM2, OSA Troponins have peaked at 2900 and are downtrending at this time.   LDL within goal at this time Heparin drip initiated by cardiology  will likely need heart catheterization prior to discharge. Aspirin 81 mg daily Formal echocardiogram pending improvement in tachycardia  Narrow complex tachycardia Patient presenting with narrow complex tachycardia with right bundle branch block with rates persistently in the 150s.   6 mg of adenosine and then 12 mg of adenosine without any improvement in tachycardia.   Diltiazem infusion started and titrated to maximum dose without improvement.   Given results demonstrating thyroid storm, was transitioned to beta-blocker. Metoprolol  5 mg IV injection every 4 hours as needed Metoprolol p.o. 25 mg every 6 hours - endocrine recommends would not increase   Type 2 diabetes mellitus (HCC) SSI, resistant scale A1c pending  Hypertension Treating cardiac issues as above  including antihypertensives Will optimize regimen prior to discharge   OSA (obstructive sleep apnea) CPAP at night while admitted Encouraged improved compliance at home postdischarge  Asthma Albuterol as needed for wheezing  Alcohol use Patient states he is drinking at least 3-4 alcohol beverages per day 5 days out of the week and then 7-9 beverages per day on the other 2 days.  Although patient is certainly overusing alcohol, no evidence of abuse at this time. Start daily folic acid and thiamine    DVT prophylaxis: heparin drip for NSTEMI Pertinent IV fluids/nutrition: no continuous IV fluids in setting of heart failure  Central lines / invasive devices: none  Code Status: FULL CODE Family Communication: support person at bedside on rounds   Disposition: inpatient  TOC needs: none at this time Barriers to discharge / significant pending items: treating the above, anticipate will be here several days at least and will likely need cardiac cath, pending clinical course

## 2022-06-25 NOTE — Progress Notes (Signed)
ANTICOAGULATION CONSULT NOTE - Initial Consult  Pharmacy Consult for IV heparin Indication: chest pain/ACS  No Known Allergies  Patient Measurements: Height: 5\' 6"  (167.6 cm) Weight: 118.6 kg (261 lb 7.5 oz) IBW/kg (Calculated) : 63.8 Heparin Dosing Weight: 96 kg  Vital Signs: Temp: 98.2 F (36.8 C) (10/10 0600) Temp Source: Oral (10/10 0430) BP: 119/81 (10/10 0600) Pulse Rate: 73 (10/10 0600)  Labs: Recent Labs    06/24/22 1150 06/24/22 1414 06/24/22 1548 06/24/22 2055 06/24/22 2302 06/25/22 0516  HGB 12.7*  --   --   --   --  12.6*  HCT 39.8  --   --   --   --  39.1  PLT 324  --   --   --   --  341  APTT  --   --  28  --   --   --   LABPROT  --   --  15.2  --   --   --   INR  --   --  1.2  --   --   --   HEPARINUNFRC  --   --   --  0.32 0.29* 0.66  CREATININE 0.94  0.92  --   --   --   --  1.18  TROPONINIHS 2,906* 2,727*  --   --   --   --      Estimated Creatinine Clearance: 104.9 mL/min (by C-G formula based on SCr of 1.18 mg/dL).   Medical History: Past Medical History:  Diagnosis Date   Asthma    Hypertension     Medications:  Heparin Dosing Weight: 96 kg No PTA anticoagulation per my chart review  Assessment: 36 year old male with h/o HTN, uncontrolled T2DM, and OSA presenting with new onset CHF and c/f NSTEMI. Pharmacy consulted for initiation/mgmt of heparin gtt.  Date Time aPTT/HL Rate/Comment 1009 2302 0.29  Subthera; 1250>>1450 un/hr     1010 0516 0.66  Thera x2; 1450 un/hr  BL PT-INR 1.2, BL aPTT 28. H&H stable/WNL  Goal of Therapy:  Heparin level 0.3-0.7 units/ml Monitor platelets by anticoagulation protocol: Yes   Plan:  Therapeutic x1; HL 0.66 (goal 0.3-0.7). Continue heparin infusion to 1450 units/hr Recheck anti-Xa level in 6 hours to confirm rate and then daily once consecutively therapeutic. Continue to monitor H&H and platelets daily while on heparin gtt.  Shanon Brow Nicha Hemann 06/25/2022,6:29 AM

## 2022-06-25 NOTE — Progress Notes (Signed)
ANTICOAGULATION CONSULT NOTE  Pharmacy Consult for IV heparin Indication: chest pain/ACS  No Known Allergies  Patient Measurements: Height: 5\' 6"  (167.6 cm) Weight: 118.6 kg (261 lb 7.5 oz) IBW/kg (Calculated) : 63.8 Heparin Dosing Weight: 96 kg  Vital Signs: Temp: 98.2 F (36.8 C) (10/10 0600) Temp Source: Oral (10/10 0430) BP: 109/71 (10/10 0755) Pulse Rate: 73 (10/10 0600)  Labs: Recent Labs    06/24/22 1150 06/24/22 1414 06/24/22 1548 06/24/22 2055 06/24/22 2302 06/25/22 0516  HGB 12.7*  --   --   --   --  12.6*  HCT 39.8  --   --   --   --  39.1  PLT 324  --   --   --   --  341  APTT  --   --  28  --   --   --   LABPROT  --   --  15.2  --   --   --   INR  --   --  1.2  --   --   --   HEPARINUNFRC  --   --   --  0.32 0.29* 0.66  CREATININE 0.94  0.92  --   --   --   --  1.18  TROPONINIHS 2,906* 2,727*  --   --   --   --      Estimated Creatinine Clearance: 104.9 mL/min (by C-G formula based on SCr of 1.18 mg/dL).   Medical History: Past Medical History:  Diagnosis Date   Asthma    Hypertension     Medications:  Heparin Dosing Weight: 96 kg No PTA anticoagulation per my chart review  Assessment: 36 year old male with h/o HTN, uncontrolled T2DM, and OSA presenting with new onset CHF and c/f NSTEMI. Pharmacy consulted for initiation/mgmt of heparin gtt.  Goal of Therapy:  Heparin level 0.3-0.7 units/ml Monitor platelets by anticoagulation protocol: Yes   Plan:  Heparin level remains therapeutic  Continue heparin infusion to 1450 units/hr Recheck anti-Xa level once daily while therapeutic Continue to monitor H&H and platelets daily while on heparin gtt.  Dallie Piles 06/25/2022,8:45 AM

## 2022-06-25 NOTE — Progress Notes (Addendum)
PROGRESS NOTE    Arthur Barnes   UTM:[id number] DOB: 01-24-1986  DOA: 06/24/2022 Date of Service: 06/25/22 PCP: [facility]     Brief Narrative / Hospital Course:  Arthur Barnes is a 36 y.o. male with medical history significant of HTN, uncontrolled T2DM and OSA who presents to the ED with c/o leg swelling and SOB x1 week, chest pain x 1 day brief episode.  10/09: On arrival to the ED, patient was hypertensive at 175/135 with heart rate of 155.  He was saturating at 95% on room air prior to being placed on 2 L nasal cannula of supplemental oxygen for work of breathing.  EKG sinus tachycardia up to 155.  CXR cardiomegaly with left pleural effusion, interstitial edema.  Given hypervolemia on examination, he was given one-time dose of Lasix.  S3 gallop, +2 edema to knees on exam.  Admitted for NSTEMI, acute heart failure, due to thyroid storm with undetectable TSH and elevated free T4 at 1.34.  Cardiology and PCCM consulted. 10/10: Changed methimazole to PTU. Significant tachycardia but unlikely to correct until underlying hyperthyroidism improves. Independently, Dr Karna Christmas spoke w/ Dr Tedd Sias [facility] Endocrinology, I secure chatted w/ Dr Lucianne Muss of [facility] - both specialists agree unlikely true thyroid storm, cardiac issues seem to be primary concern, unlikely patient will improve until several weeks on medicine. Remains on heparin for NSTEMI and if cardiology clears for discharge he can go on methimazole or PTU, would stop steroids and iodide, would NOT increase calcium channel blocker or beta blocker as this may have minimal effect on HR and will be likely to cause hypotension    Consultants:  Cardiology PCCM  Procedures: none      ASSESSMENT & PLAN:   Principal Problem:   NSTEMI (non-ST elevated myocardial infarction) (HCC) Active Problems:   Thyroid storm   Acute heart failure (HCC)   Narrow complex tachycardia   Type 2 diabetes mellitus (HCC)   Hypertension   OSA  (obstructive sleep apnea)   Asthma   Alcohol use   Hyperthyroidism uncertain etiology - ruled out Thyroid storm Patient with severe sinus tachycardia, acute heart failure and NSTEMI.   Undetectable TSH at less than 0.01 and elevated free T4 at 1.34 but minimal elevation makes thyroid storm unlikely.   Admit to stepdown unit pending improvement in HR PCCM and cardiology following; appreciate their recommendations Start methimazole 20 mg every 4 hours --> changed to PTU per UpToDate recs  Start hydrocortisone 100 mg every 8 hours --> d/c per endocrine recs Started SSKI iodine per UpToDate recs --> d/c per endocrine recs  Added thyroid Ab, eval for Graves Thyroid US, eval for nodular goiter / possible adenoma   Acute heart failure (HCC) Complicated by hyperthyroid, Hx HTN, DM2, OSA Cardiology consulted; appreciate their recommendations Start Lasix 40 mg IV twice daily Monitor magnesium potassium daily while on diuretics Formal echocardiogram pending improvement in heart rate Initiate guideline directed medical therapy prior to discharge Strict in and outs Daily weights  NSTEMI (non-ST elevated myocardial infarction) (HCC) Complicated by hyperthyroid, Hx HTN, DM2, OSA Troponins have peaked at 2900 and are downtrending at this time.   LDL within goal at this time Heparin drip initiated by cardiology  will likely need heart catheterization prior to discharge. Aspirin 81 mg daily Formal echocardiogram pending improvement in tachycardia  Narrow complex tachycardia Patient presenting with narrow complex tachycardia with right bundle branch block with rates persistently in the 150s.   6 mg of adenosine and  then 12 mg of adenosine without any improvement in tachycardia.   Diltiazem infusion started and titrated to maximum dose without improvement.   Given results demonstrating thyroid storm, was transitioned to beta-blocker. Metoprolol 5 mg IV injection every 4 hours as  needed Metoprolol p.o. 25 mg every 6 hours - endocrine recommends would not increase   Type 2 diabetes mellitus (HCC) SSI, resistant scale A1c pending  Hypertension Treating cardiac issues as above including antihypertensives Will optimize regimen prior to discharge   OSA (obstructive sleep apnea) CPAP at night while admitted Encouraged improved compliance at home postdischarge  Asthma Albuterol as needed for wheezing  Alcohol use Patient states he is drinking at least 3-4 alcohol beverages per day 5 days out of the week and then 7-9 beverages per day on the other 2 days.  Although patient is certainly overusing alcohol, no evidence of abuse at this time. Start daily folic acid and thiamine    DVT prophylaxis: heparin drip for NSTEMI Pertinent IV fluids/nutrition: no continuous IV fluids in setting of heart failure  Central lines / invasive devices: none  Code Status: FULL CODE Family Communication: support person at bedside on rounds   Disposition: inpatient  TOC needs: none at this time Barriers to discharge / significant pending items: treating the above, anticipate will be here several days at least and will likely need cardiac cath, pending clinical course              Subjective:  Patient reports feeling okay, no palpitations or chest pain. NO SOB.        Objective:  Vitals:   06/25/22 1100 06/25/22 1200 06/25/22 1300 06/25/22 1400  BP: 110/74 111/77 99/80 116/63  Pulse: (!) 146 (!) 145 (!) 144 (!) 147  Resp: (!) 31 (!) 24 (!) 27 (!) 24  Temp:    98.3 F (36.8 C)  TempSrc:    Oral  SpO2: (!) 80% 92% 92% 94%  Weight:      Height:        Intake/Output Summary (Last 24 hours) at 06/25/2022 1713 Last data filed at 06/25/2022 1100 Gross per 24 hour  Intake 549.3 ml  Output 1400 ml  Net -850.7 ml   Filed Weights   06/24/22 1423 06/25/22 0500  Weight: 133.8 kg 118.6 kg    Examination:  Constitutional:  VS as above General Appearance:  alert, well-developed, well-nourished, NAD Respiratory: Normal respiratory effort No wheeze No rhonchi No rales Cardiovascular: S1/S2 normal Tachycardic No murmur/rub/gallop appreciated but tachycardia limits exam  No JVD Trace lower extremity edema Gastrointestinal: No tenderness Musculoskeletal:  No clubbing/cyanosis of digits Symmetrical movement in all extremities Neurological: No cranial nerve deficit on limited exam Alert Psychiatric: Normal judgment/insight Normal mood and affect       Scheduled Medications:   atenolol  25 mg Oral Daily   Chlorhexidine Gluconate Cloth  6 each Topical Q0600   cholestyramine light  4 g Oral TID   folic acid  1 mg Oral Daily   furosemide  40 mg Intravenous Q12H   insulin aspart  0-20 Units Subcutaneous TID WC   LORazepam  1 mg Intravenous Q3H   multivitamin with minerals  1 tablet Oral Daily   propylthiouracil  50 mg Oral Q8H   sodium chloride flush  3 mL Intravenous Q12H   [START ON 06/26/2022] thiamine  100 mg Oral Daily   Or   [START ON 06/26/2022] thiamine  100 mg Intravenous Daily    Continuous Infusions:  diltiazem (CARDIZEM)  infusion 10 mg/hr (06/25/22 1144)   heparin 1,450 Units/hr (06/25/22 1000)    PRN Medications:  acetaminophen **OR** acetaminophen, albuterol, metoprolol tartrate, mouth rinse, polyethylene glycol  Antimicrobials:  Anti-infectives (From admission, onward)    None       Data Reviewed: I have personally reviewed following labs and imaging studies  CBC: Recent Labs  Lab 06/24/22 1150 06/25/22 0516  WBC 8.3 9.8  HGB 12.7* 12.6*  HCT 39.8 39.1  MCV 90.9 90.5  PLT 324 341   Basic Metabolic Panel: Recent Labs  Lab 06/24/22 1150 06/24/22 1506 06/25/22 0516  NA 137  139  --  134*  K 3.8  3.8  --  4.5  CL 102  102  --  101  CO2 26  25  --  25  GLUCOSE 96  100*  --  148*  BUN 7  7  --  19  CREATININE 0.94  0.92  --  1.18  CALCIUM 8.7*  9.0  --  9.1  MG  --  2.0 2.0   PHOS  --   --  4.4   GFR: Estimated Creatinine Clearance: 104.9 mL/min (by C-G formula based on SCr of 1.18 mg/dL). Liver Function Tests: Recent Labs  Lab 06/24/22 1150 06/25/22 0516  AST 29 24  ALT 35 31  ALKPHOS 67 65  BILITOT 0.8 1.0  PROT 7.3 7.8  ALBUMIN 3.4* 3.4*   No results for input(s): "LIPASE", "AMYLASE" in the last 168 hours. No results for input(s): "AMMONIA" in the last 168 hours. Coagulation Profile: Recent Labs  Lab 06/24/22 1548  INR 1.2   Cardiac Enzymes: No results for input(s): "CKTOTAL", "CKMB", "CKMBINDEX", "TROPONINI" in the last 168 hours. BNP (last 3 results) No results for input(s): "PROBNP" in the last 8760 hours. HbA1C: Recent Labs    06/24/22 1506  HGBA1C 6.5*   CBG: Recent Labs  Lab 06/24/22 2308 06/25/22 0553 06/25/22 0741 06/25/22 1114 06/25/22 1645  GLUCAP 148* 133* 126* 147* 148*   Lipid Profile: Recent Labs    06/24/22 1506  CHOL 113  HDL 35*  LDLCALC 64  TRIG 71  CHOLHDL 3.2   Thyroid Function Tests: Recent Labs    06/24/22 1506 06/24/22 1612  TSH <0.010*  --   FREET4  --  1.34*   Anemia Panel: No results for input(s): "VITAMINB12", "FOLATE", "FERRITIN", "TIBC", "IRON", "RETICCTPCT" in the last 72 hours. Urine analysis:    Component Value Date/Time   COLORURINE STRAW (A) 06/24/2022 1950   APPEARANCEUR CLEAR (A) 06/24/2022 1950   LABSPEC 1.006 06/24/2022 1950   PHURINE 6.0 06/24/2022 1950   GLUCOSEU NEGATIVE 06/24/2022 1950   HGBUR NEGATIVE 06/24/2022 1950   BILIRUBINUR NEGATIVE 06/24/2022 1950   KETONESUR NEGATIVE 06/24/2022 1950   PROTEINUR NEGATIVE 06/24/2022 1950   NITRITE NEGATIVE 06/24/2022 1950   LEUKOCYTESUR NEGATIVE 06/24/2022 1950   Sepsis Labs: @LABRCNTIP (procalcitonin:4,lacticidven:4)  Recent Results (from the past 240 hour(s))  Resp Panel by RT-PCR (Flu A&B, Covid) Anterior Nasal Swab     Status: None   Collection Time: 06/24/22 12:05 PM   Specimen: Anterior Nasal Swab  Result Value  Ref Range Status   SARS Coronavirus 2 by RT PCR NEGATIVE NEGATIVE Final    Comment: (NOTE) SARS-CoV-2 target nucleic acids are NOT DETECTED.  The SARS-CoV-2 RNA is generally detectable in upper respiratory specimens during the acute phase of infection. The lowest concentration of SARS-CoV-2 viral copies this assay can detect is 138 copies/mL. A negative result does not preclude SARS-Cov-2  infection and should not be used as the sole basis for treatment or other patient management decisions. A negative result may occur with  improper specimen collection/handling, submission of specimen other than nasopharyngeal swab, presence of viral mutation(s) within the areas targeted by this assay, and inadequate number of viral copies(<138 copies/mL). A negative result must be combined with clinical observations, patient history, and epidemiological information. The expected result is Negative.  Fact Sheet for Patients:  EntrepreneurPulse.com.au  Fact Sheet for Healthcare Providers:  IncredibleEmployment.be  This test is no t yet approved or cleared by the [place] FDA and  has been authorized for detection and/or diagnosis of SARS-CoV-2 by FDA under an Emergency Use Authorization (EUA). This EUA will remain  in effect (meaning this test can be used) for the duration of the COVID-19 declaration under Section 564(b)(1) of the Act, 21 U.S.C.section 360bbb-3(b)(1), unless the authorization is terminated  or revoked sooner.       Influenza A by PCR NEGATIVE NEGATIVE Final   Influenza B by PCR NEGATIVE NEGATIVE Final    Comment: (NOTE) The Xpert Xpress SARS-CoV-2/FLU/RSV plus assay is intended as an aid in the diagnosis of influenza from Nasopharyngeal swab specimens and should not be used as a sole basis for treatment. Nasal washings and aspirates are unacceptable for Xpert Xpress SARS-CoV-2/FLU/RSV testing.  Fact Sheet for  Patients: EntrepreneurPulse.com.au  Fact Sheet for Healthcare Providers: IncredibleEmployment.be  This test is not yet approved or cleared by the [place] FDA and has been authorized for detection and/or diagnosis of SARS-CoV-2 by FDA under an Emergency Use Authorization (EUA). This EUA will remain in effect (meaning this test can be used) for the duration of the COVID-19 declaration under Section 564(b)(1) of the Act, 21 U.S.C. section 360bbb-3(b)(1), unless the authorization is terminated or revoked.  Performed at [facility], [address] [place], [place] [address]   Culture, blood (Routine X 2) w Reflex to ID Panel     Status: None (Preliminary result)   Collection Time: 06/24/22  7:37 PM   Specimen: BLOOD  Result Value Ref Range Status   Specimen Description BLOOD BLOOD LEFT HAND  Final   Special Requests   Final    BOTTLES DRAWN AEROBIC AND ANAEROBIC Blood Culture adequate volume   Culture   Final    NO GROWTH < 12 HOURS Performed at [facility] [address], [place], [place] [address]    Report Status PENDING  Incomplete  Culture, blood (Routine X 2) w Reflex to ID Panel     Status: None (Preliminary result)   Collection Time: 06/24/22  7:42 PM   Specimen: BLOOD  Result Value Ref Range Status   Specimen Description BLOOD BLOOD RIGHT HAND  Final   Special Requests   Final    BOTTLES DRAWN AEROBIC AND ANAEROBIC Blood Culture results may not be optimal due to an inadequate volume of blood received in culture bottles   Culture   Final    NO GROWTH < 12 HOURS Performed at [facility], [address] [place], [place] [address]    Report Status PENDING  Incomplete  MRSA Next Gen by PCR, Nasal     Status: None   Collection Time: 06/25/22  6:00 AM   Specimen: Nasal Mucosa; Nasal Swab  Result Value Ref Range Status   MRSA by PCR Next Gen NOT DETECTED NOT DETECTED Final    Comment: (NOTE) The  GeneXpert MRSA Assay (FDA approved for NASAL specimens only), is  one component of a comprehensive MRSA colonization surveillance program. It is not intended to diagnose MRSA infection nor to guide or monitor treatment for MRSA infections. Test performance is not FDA approved in patients less than 65 years old. Performed at [facility], [address] [place], [place] [address]          Radiology Studies: No results found.          LOS: 1 day    Time spent: 50 minutes     Sunnie Nielsen, DO [facility] 06/25/2022, 5:13 PM   Staff may message me via secure chat in Epic  but this may not receive immediate response,  please page for urgent matters!  If 7PM-7AM, please contact night-coverage [email or web address]  Dictation software was used to generate the above note. Typos may occur and escape review, as with typed/written notes. Please contact Dr Lyn Hollingshead directly for clarity if needed.

## 2022-06-26 DIAGNOSIS — I429 Cardiomyopathy, unspecified: Secondary | ICD-10-CM | POA: Diagnosis not present

## 2022-06-26 DIAGNOSIS — I4892 Unspecified atrial flutter: Secondary | ICD-10-CM

## 2022-06-26 DIAGNOSIS — E0590 Thyrotoxicosis, unspecified without thyrotoxic crisis or storm: Secondary | ICD-10-CM | POA: Diagnosis not present

## 2022-06-26 DIAGNOSIS — I214 Non-ST elevation (NSTEMI) myocardial infarction: Secondary | ICD-10-CM | POA: Diagnosis not present

## 2022-06-26 DIAGNOSIS — E876 Hypokalemia: Secondary | ICD-10-CM

## 2022-06-26 LAB — BASIC METABOLIC PANEL
Anion gap: 9 (ref 5–15)
BUN: 18 mg/dL (ref 6–20)
CO2: 25 mmol/L (ref 22–32)
Calcium: 8.6 mg/dL — ABNORMAL LOW (ref 8.9–10.3)
Chloride: 103 mmol/L (ref 98–111)
Creatinine, Ser: 1.14 mg/dL (ref 0.61–1.24)
GFR, Estimated: >60 mL/min (ref >60)
Glucose, Bld: 130 mg/dL — ABNORMAL HIGH (ref 70–99)
Potassium: 3.4 mmol/L — ABNORMAL LOW (ref 3.5–5.1)
Sodium: 137 mmol/L (ref 135–145)

## 2022-06-26 LAB — GLUCOSE, CAPILLARY
Glucose-Capillary: 157 mg/dL — ABNORMAL HIGH (ref 70–99)
Glucose-Capillary: 127 mg/dL — ABNORMAL HIGH (ref 70–99)
Glucose-Capillary: 122 mg/dL — ABNORMAL HIGH (ref 70–99)
Glucose-Capillary: 110 mg/dL — ABNORMAL HIGH (ref 70–99)

## 2022-06-26 LAB — T4, FREE: Free T4: 1.52 ng/dL — ABNORMAL HIGH (ref 0.61–1.12)

## 2022-06-26 LAB — PHOSPHORUS: Phosphorus: 4.6 mg/dL (ref 2.5–4.6)

## 2022-06-26 LAB — THYROID ANTIBODIES
Thyroglobulin Antibody: 5.3 IU/mL — ABNORMAL HIGH (ref 0.0–0.9)
Thyroperoxidase Ab SerPl-aCnc: 465 IU/mL — ABNORMAL HIGH (ref 0–34)

## 2022-06-26 LAB — MAGNESIUM: Magnesium: 2.1 mg/dL (ref 1.7–2.4)

## 2022-06-26 LAB — CBC
HCT: 39.5 % (ref 39.0–52.0)
Hemoglobin: 12.7 g/dL — ABNORMAL LOW (ref 13.0–17.0)
MCH: 29.4 pg (ref 26.0–34.0)
MCHC: 32.2 g/dL (ref 30.0–36.0)
MCV: 91.4 fL (ref 80.0–100.0)
Platelets: 314 10*3/uL (ref 150–400)
RBC: 4.32 MIL/uL (ref 4.22–5.81)
RDW: 14 % (ref 11.5–15.5)
WBC: 8.6 10*3/uL (ref 4.0–10.5)
nRBC: 0 % (ref 0.0–0.2)

## 2022-06-26 LAB — PROCALCITONIN: Procalcitonin: <0.1 ng/mL

## 2022-06-26 LAB — HEPARIN LEVEL (UNFRACTIONATED): Heparin Unfractionated: 0.54 IU/mL (ref 0.30–0.70)

## 2022-06-26 MED ORDER — METOPROLOL TARTRATE 25 MG PO TABS
25.0000 mg | ORAL_TABLET | Freq: Four times a day (QID) | ORAL | Status: DC
  Administered 2022-06-26 – 2022-06-27 (×4): 25 mg via ORAL
  Filled 2022-06-26 (×4): qty 1

## 2022-06-26 MED ORDER — METOPROLOL SUCCINATE ER 50 MG PO TB24
25.0000 mg | ORAL_TABLET | Freq: Two times a day (BID) | ORAL | Status: DC
  Administered 2022-06-26: 25 mg via ORAL
  Filled 2022-06-26: qty 1

## 2022-06-26 MED ORDER — POTASSIUM CHLORIDE CRYS ER 20 MEQ PO TBCR
40.0000 meq | EXTENDED_RELEASE_TABLET | Freq: Once | ORAL | Status: AC
  Administered 2022-06-26: 40 meq via ORAL
  Filled 2022-06-26: qty 2

## 2022-06-26 NOTE — Progress Notes (Signed)
1615 dose of ativan not given r/t to drowsiness and absence of withdrawal symptoms.   1730- Patient is more alert. Absence of withdrawal symptoms.  MD aware of held doses. No new orders for change in therapy. Verbal order to hold scheduled ativan for drowsiness.

## 2022-06-26 NOTE — Progress Notes (Signed)
PROGRESS NOTE    Arthur Barnes  ZOX:[id number] DOB: 1986-08-18 DOA: 06/24/2022 PCP: [facility]    Brief Narrative:  Arthur Barnes is a 36 y.o. male with medical history significant of HTN, uncontrolled T2DM and OSA who presents to the ED with c/o leg swelling and SOB x1 week, chest pain x 1 day brief episode.  10/09: On arrival to the ED, patient was hypertensive at 175/135 with heart rate of 155.  He was saturating at 95% on room air prior to being placed on 2 L nasal cannula of supplemental oxygen for work of breathing.  EKG sinus tachycardia up to 155.  CXR cardiomegaly with left pleural effusion, interstitial edema.  Given hypervolemia on examination, he was given one-time dose of Lasix.  S3 gallop, +2 edema to knees on exam.  Admitted for NSTEMI, acute heart failure, due to thyroid storm with undetectable TSH and elevated free T4 at 1.34.  Cardiology and PCCM consulted. 10/10: Changed methimazole to PTU. Significant tachycardia but unlikely to correct until underlying hyperthyroidism improves. Independently, Dr Karna Christmas spoke w/ Dr Tedd Sias [facility] Endocrinology, I secure chatted w/ Dr Lucianne Muss of [facility] - both specialists agree unlikely true thyroid storm, cardiac issues seem to be primary concern, unlikely patient will improve until several weeks on medicine. Remains on heparin for NSTEMI and if cardiology clears for discharge he can go on methimazole or PTU, would stop steroids and iodide, would NOT increase calcium channel blocker or beta blocker as this may have minimal effect on HR and will be likely to cause hypotension     10/11 dc ativan.    Consultants:  Cardiology PCCM  Procedures:   Antimicrobials:      Subjective: Sitting in chair. Has no complaints. Using his cpapa  Objective: Vitals:   06/26/22 1730 06/26/22 1800 06/26/22 1830 06/26/22 1900  BP: 111/86 124/88 137/77 125/67  Pulse: 65 65 (!) 56 (!) 50  Resp: (!) 26 (!) 25 (!) 30 (!) 25  Temp:    98.2 F  (36.8 C)  TempSrc:    Oral  SpO2: 94% 95% 95% 92%  Weight:      Height:        Intake/Output Summary (Last 24 hours) at 06/26/2022 1944 Last data filed at 06/26/2022 1900 Gross per 24 hour  Intake 1948.89 ml  Output 2150 ml  Net -201.11 ml   Filed Weights   06/24/22 1423 06/25/22 0500 06/26/22 0500  Weight: 133.8 kg 118.6 kg 119 kg    Examination: Calm, NAD Decrease bs, no wheezing Reg tachy mildly s1/s2 no gallop Soft benign +bs + LE  edema Answers questions appropriatly Mood and affect appropriate in current setting     Data Reviewed: I have personally reviewed following labs and imaging studies  CBC: Recent Labs  Lab 06/24/22 1150 06/25/22 0516 06/26/22 0603  WBC 8.3 9.8 8.6  HGB 12.7* 12.6* 12.7*  HCT 39.8 39.1 39.5  MCV 90.9 90.5 91.4  PLT 324 341 314   Basic Metabolic Panel: Recent Labs  Lab 06/24/22 1150 06/24/22 1506 06/25/22 0516 06/26/22 0603  NA 137  139  --  134* 137  K 3.8  3.8  --  4.5 3.4*  CL 102  102  --  101 103  CO2 26  25  --  25 25  GLUCOSE 96  100*  --  148* 130*  BUN 7  7  --  19 18  CREATININE 0.94  0.92  --  1.18 1.14  CALCIUM 8.7*  9.0  --  9.1 8.6*  MG  --  2.0 2.0 2.1  PHOS  --   --  4.4 4.6   GFR: Estimated Creatinine Clearance: 108.8 mL/min (by C-G formula based on SCr of 1.14 mg/dL). Liver Function Tests: Recent Labs  Lab 06/24/22 1150 06/25/22 0516  AST 29 24  ALT 35 31  ALKPHOS 67 65  BILITOT 0.8 1.0  PROT 7.3 7.8  ALBUMIN 3.4* 3.4*   No results for input(s): "LIPASE", "AMYLASE" in the last 168 hours. No results for input(s): "AMMONIA" in the last 168 hours. Coagulation Profile: Recent Labs  Lab 06/24/22 1548  INR 1.2   Cardiac Enzymes: No results for input(s): "CKTOTAL", "CKMB", "CKMBINDEX", "TROPONINI" in the last 168 hours. BNP (last 3 results) No results for input(s): "PROBNP" in the last 8760 hours. HbA1C: Recent Labs    06/24/22 1506  HGBA1C 6.5*   CBG: Recent Labs  Lab  06/25/22 1645 06/25/22 2156 06/26/22 0747 06/26/22 1120 06/26/22 1609  GLUCAP 148* 174* 157* 127* 122*   Lipid Profile: Recent Labs    06/24/22 1506  CHOL 113  HDL 35*  LDLCALC 64  TRIG 71  CHOLHDL 3.2   Thyroid Function Tests: Recent Labs    06/24/22 1506 06/24/22 1612 06/26/22 0603  TSH <0.010*  --   --   FREET4  --    < > 1.52*   < > = values in this interval not displayed.   Anemia Panel: No results for input(s): "VITAMINB12", "FOLATE", "FERRITIN", "TIBC", "IRON", "RETICCTPCT" in the last 72 hours. Sepsis Labs: Recent Labs  Lab 06/24/22 1950 06/25/22 0516 06/26/22 0603  PROCALCITON <0.10 <0.10 <0.10  LATICACIDVEN 1.2  --   --     Recent Results (from the past 240 hour(s))  Resp Panel by RT-PCR (Flu A&B, Covid) Anterior Nasal Swab     Status: None   Collection Time: 06/24/22 12:05 PM   Specimen: Anterior Nasal Swab  Result Value Ref Range Status   SARS Coronavirus 2 by RT PCR NEGATIVE NEGATIVE Final    Comment: (NOTE) SARS-CoV-2 target nucleic acids are NOT DETECTED.  The SARS-CoV-2 RNA is generally detectable in upper respiratory specimens during the acute phase of infection. The lowest concentration of SARS-CoV-2 viral copies this assay can detect is 138 copies/mL. A negative result does not preclude SARS-Cov-2 infection and should not be used as the sole basis for treatment or other patient management decisions. A negative result may occur with  improper specimen collection/handling, submission of specimen other than nasopharyngeal swab, presence of viral mutation(s) within the areas targeted by this assay, and inadequate number of viral copies(<138 copies/mL). A negative result must be combined with clinical observations, patient history, and epidemiological information. The expected result is Negative.  Fact Sheet for Patients:  EntrepreneurPulse.com.au  Fact Sheet for Healthcare Providers:   IncredibleEmployment.be  This test is no t yet approved or cleared by the [place] FDA and  has been authorized for detection and/or diagnosis of SARS-CoV-2 by FDA under an Emergency Use Authorization (EUA). This EUA will remain  in effect (meaning this test can be used) for the duration of the COVID-19 declaration under Section 564(b)(1) of the Act, 21 U.S.C.section 360bbb-3(b)(1), unless the authorization is terminated  or revoked sooner.       Influenza A by PCR NEGATIVE NEGATIVE Final   Influenza B by PCR NEGATIVE NEGATIVE Final    Comment: (NOTE) The Xpert Xpress SARS-CoV-2/FLU/RSV plus assay is intended as an  aid in the diagnosis of influenza from Nasopharyngeal swab specimens and should not be used as a sole basis for treatment. Nasal washings and aspirates are unacceptable for Xpert Xpress SARS-CoV-2/FLU/RSV testing.  Fact Sheet for Patients: BloggerCourse.com  Fact Sheet for Healthcare Providers: SeriousBroker.it  This test is not yet approved or cleared by the [place] FDA and has been authorized for detection and/or diagnosis of SARS-CoV-2 by FDA under an Emergency Use Authorization (EUA). This EUA will remain in effect (meaning this test can be used) for the duration of the COVID-19 declaration under Section 564(b)(1) of the Act, 21 U.S.C. section 360bbb-3(b)(1), unless the authorization is terminated or revoked.  Performed at [facility], [address] [place], [place] [address]   Culture, blood (Routine X 2) w Reflex to ID Panel     Status: None (Preliminary result)   Collection Time: 06/24/22  7:37 PM   Specimen: BLOOD  Result Value Ref Range Status   Specimen Description BLOOD BLOOD LEFT HAND  Final   Special Requests   Final    BOTTLES DRAWN AEROBIC AND ANAEROBIC Blood Culture adequate volume   Culture   Final    NO GROWTH 2 DAYS Performed at [facility], [address], [place], [place] [address]    Report Status PENDING  Incomplete  Culture, blood (Routine X 2) w Reflex to ID Panel     Status: None (Preliminary result)   Collection Time: 06/24/22  7:42 PM   Specimen: BLOOD  Result Value Ref Range Status   Specimen Description BLOOD BLOOD RIGHT HAND  Final   Special Requests   Final    BOTTLES DRAWN AEROBIC AND ANAEROBIC Blood Culture results may not be optimal due to an inadequate volume of blood received in culture bottles   Culture   Final    NO GROWTH 2 DAYS Performed at [facility], [address] [place], [place] [address]    Report Status PENDING  Incomplete  MRSA Next Gen by PCR, Nasal     Status: None   Collection Time: 06/25/22  6:00 AM   Specimen: Nasal Mucosa; Nasal Swab  Result Value Ref Range Status   MRSA by PCR Next Gen NOT DETECTED NOT DETECTED Final    Comment: (NOTE) The GeneXpert MRSA Assay (FDA approved for NASAL specimens only), is one component of a comprehensive MRSA colonization surveillance program. It is not intended to diagnose MRSA infection nor to guide or monitor treatment for MRSA infections. Test performance is not FDA approved in patients less than 54 years old. Performed at [facility], [address] [place], [place] [address]          Radiology Studies: US THYROID  Result Date: 06/25/2022 CLINICAL DATA:  Thyroid storm EXAM: THYROID ULTRASOUND TECHNIQUE: Ultrasound examination of the thyroid gland and adjacent soft tissues was performed. COMPARISON:  None Available. FINDINGS: Parenchymal Echotexture: Moderately heterogenous Isthmus: 1.1 cm Right lobe: 5.2 x 2.1 x 2.2 cm Left lobe: 4.5 x 2.2 x 2.1 cm _________________________________________________________ Estimated total number of nodules >/= 1 cm: 0 Number of spongiform nodules >/=  2 cm not described below (TR1): 0 Number of mixed cystic and solid nodules >/= 1.5 cm not described below (TR2): 0  _________________________________________________________ Mildly enlarged and heterogeneous thyroid gland, nonspecific for medical thyroid disease. No significant hypervascularity. Negative for nodule or focal abnormality. No regional adenopathy. IMPRESSION: Mildly enlarged heterogeneous thyroid compatible with medical thyroid disease. Negative for nodule. The above is in keeping  with the ACR TI-RADS recommendations - J Am Coll Radiol 2017;14:587-595. Electronically Signed   By: Judie Petit.  Shick M.D.   On: 06/25/2022 15:52   ECHOCARDIOGRAM COMPLETE  Result Date: 06/25/2022    ECHOCARDIOGRAM REPORT   Patient Name:   Barton Fanny Date of Exam: 06/25/2022 Medical Rec #:  [id number]      Height:       66.0 in Accession #:    [id number]     Weight:       261.5 lb Date of Birth:  Sep 04, 1986      BSA:          2.241 m Patient Age:    36 years       BP:           137/117 mmHg Patient Gender: M              HR:           73 bpm. Exam Location:  [facility] Procedure: 2D Echo, Cardiac Doppler, Color Doppler and Intracardiac            Opacification Agent Indications:     NSTEMI I21.4  History:         Patient has no prior history of Echocardiogram examinations.                  Risk Factors:Current Smoker and Hypertension. Asthma.  Sonographer:     Cristela Blue Referring Phys:  [id number] Raymon Mutton DUNN Diagnosing Phys: Yvonne Kendall MD  Sonographer Comments: Suboptimal apical window. IMPRESSIONS  1. Left ventricular ejection fraction, by estimation, is 40 to 45%. The left ventricle has moderately decreased function. The left ventricle demonstrates global hypokinesis. The left ventricular internal cavity size was mildly dilated. There is moderate  left ventricular hypertrophy. Left ventricular diastolic parameters are indeterminate.  2. Right ventricular systolic function is mildly reduced. The right ventricular size is mildly enlarged. There is normal pulmonary artery systolic pressure.  3. Right atrial size was mildly dilated.  4. The  mitral valve is normal in structure. Mild mitral valve regurgitation.  5. The aortic valve was not well visualized. Aortic valve regurgitation is not visualized. No aortic stenosis is present.  6. The inferior vena cava is dilated in size with <50% respiratory variability, suggesting right atrial pressure of 15 mmHg. FINDINGS  Left Ventricle: Left ventricular ejection fraction, by estimation, is 40 to 45%. The left ventricle has moderately decreased function. The left ventricle demonstrates global hypokinesis. Definity contrast agent was given IV to delineate the left ventricular endocardial borders. The left ventricular internal cavity size was mildly dilated. There is moderate left ventricular hypertrophy. Left ventricular diastolic parameters are indeterminate. Right Ventricle: The right ventricular size is mildly enlarged. No increase in right ventricular wall thickness. Right ventricular systolic function is mildly reduced. There is normal pulmonary artery systolic pressure. The tricuspid regurgitant velocity  is 2.13 m/s, and with an assumed right atrial pressure of 15 mmHg, the estimated right ventricular systolic pressure is 33.1 mmHg. Left Atrium: Left atrial size was normal in size. Right Atrium: Right atrial size was mildly dilated. Pericardium: There is no evidence of pericardial effusion. Mitral Valve: The mitral valve is normal in structure. Mild mitral valve regurgitation. Tricuspid Valve: The tricuspid valve is normal in structure. Tricuspid valve regurgitation is mild. Aortic Valve: The aortic valve was not well visualized. Aortic valve regurgitation is not visualized. No aortic stenosis is present. Aortic valve mean gradient measures 3.0 mmHg. Aortic valve peak  gradient measures 4.5 mmHg. Aortic valve area, by VTI measures 2.97 cm. Pulmonic Valve: The pulmonic valve was normal in structure. Pulmonic valve regurgitation is trivial. No evidence of pulmonic stenosis. Aorta: The aortic root is normal  in size and structure. Pulmonary Artery: The pulmonary artery is of normal size. Venous: The inferior vena cava is dilated in size with less than 50% respiratory variability, suggesting right atrial pressure of 15 mmHg. IAS/Shunts: The interatrial septum was not well visualized.  LEFT VENTRICLE PLAX 2D LVIDd:         5.80 cm LVIDs:         3.60 cm LV PW:         1.40 cm LV IVS:        1.30 cm LVOT diam:     2.20 cm LV SV:         57 LV SV Index:   25 LVOT Area:     3.80 cm  RIGHT VENTRICLE RV Basal diam:  4.20 cm RV Mid diam:    3.30 cm LEFT ATRIUM             Index        RIGHT ATRIUM           Index LA diam:        3.80 cm 1.70 cm/m   RA Area:     18.10 cm LA Vol (A2C):   46.2 ml 20.62 ml/m  RA Volume:   53.90 ml  24.05 ml/m LA Vol (A4C):   47.1 ml 21.02 ml/m LA Biplane Vol: 47.4 ml 21.15 ml/m  AORTIC VALVE AV Area (Vmax):    2.97 cm AV Area (Vmean):   2.44 cm AV Area (VTI):     2.97 cm AV Vmax:           106.00 cm/s AV Vmean:          80.200 cm/s AV VTI:            0.191 m AV Peak Grad:      4.5 mmHg AV Mean Grad:      3.0 mmHg LVOT Vmax:         82.80 cm/s LVOT Vmean:        51.400 cm/s LVOT VTI:          0.149 m LVOT/AV VTI ratio: 0.78  AORTA Ao Root diam: 3.60 cm MITRAL VALVE                TRICUSPID VALVE MV Area (PHT): 12.04 cm    TR Peak grad:   18.1 mmHg MV Decel Time: 63 msec      TR Vmax:        213.00 cm/s MV E velocity: 72.30 cm/s MV A velocity: 108.00 cm/s  SHUNTS MV E/A ratio:  0.67         Systemic VTI:  0.15 m                             Systemic Diam: 2.20 cm Yvonne Kendall MD Electronically signed by Yvonne Kendall MD Signature Date/Time: 06/25/2022/11:00:10 AM    Final         Scheduled Meds:  Chlorhexidine Gluconate Cloth  6 each Topical Q0600   cholestyramine light  4 g Oral TID   folic acid  1 mg Oral Daily   furosemide  40 mg Intravenous Q12H   insulin aspart  0-20 Units Subcutaneous TID WC  metoprolol tartrate  25 mg Oral QID   multivitamin with minerals  1 tablet  Oral Daily   propylthiouracil  50 mg Oral Q8H   sodium chloride flush  3 mL Intravenous Q12H   thiamine  100 mg Oral Daily   Or   thiamine  100 mg Intravenous Daily   Continuous Infusions:  diltiazem (CARDIZEM) infusion 15 mg/hr (06/26/22 1900)   heparin 1,450 Units/hr (06/26/22 1900)    Assessment & Plan:   Principal Problem:   NSTEMI (non-ST elevated myocardial infarction) (HCC) Active Problems:   Thyroid storm   Acute heart failure (HCC)   Narrow complex tachycardia   Type 2 diabetes mellitus (HCC)   Hypertension   OSA (obstructive sleep apnea)   Asthma   Alcohol use   Atrial flutter (HCC)   Cardiomyopathy (HCC)   Hyperthyroidism   Hypokalemia   Hyperthyroidism uncertain etiology - ruled out Thyroid storm Patient with severe sinus tachycardia, acute heart failure and NSTEMI.   Undetectable TSH at less than 0.01 and elevated free T4 at 1.34 but minimal elevation makes thyroid storm unlikely.   Admit to stepdown unit pending improvement in HR PCCM and cardiology following; appreciate their recommendations Start methimazole 20 mg every 4 hours --> changed to PTU per UpToDate recs  Start hydrocortisone 100 mg every 8 hours --> d/c per endocrine recs Started SSKI iodine per UpToDate recs --> d/c per endocrine recs  Added thyroid Ab, eval for Graves Thyroid US, eval for nodular goiter / possible adenoma  10/11 avoid salicylate, nsaids can increase free thyroid hormone levels    Acute heart failure (HCC) Complicated by hyperthyroid, Hx HTN, DM2, OSA Cardiology consulted; appreciate their recommendations Monitor magnesium potassium daily while on diuretics Formal echocardiogram pending improvement in heart rate Initiate guideline directed medical therapy prior to discharge Strict in and outs Daily weights 10/11 continue lasix IV HF education   Hypokalemia From iv lasix Will replace and monitor   NSTEMI (non-ST elevated myocardial infarction) (HCC) Complicated  by hyperthyroid, Hx HTN, DM2, OSA Troponins have peaked at 2900 and are downtrending at this time.   LDL within goal at this time Heparin drip initiated by cardiology  will likely need heart catheterization prior to discharge. Aspirin 81 mg daily Formal echocardiogram pending improvement in tachycardia   Narrow complex tachycardia Patient presenting with narrow complex tachycardia with right bundle branch block with rates persistently in the 150s.   6 mg of adenosine and then 12 mg of adenosine without any improvement in tachycardia.   Diltiazem infusion started and titrated to maximum dose without improvement.   Given results demonstrating thyroid storm, was transitioned to beta-blocker. Metoprolol 5 mg IV injection every 4 hours as needed Metoprolol p.o. 25 mg every 6 hours - endocrine recommends would not increase    Type 2 diabetes mellitus (HCC) SSI, resistant scale A1c pending   Hypertension Treating cardiac issues as above including antihypertensives Will optimize regimen prior to discharge    OSA (obstructive sleep apnea) CPAP at night while admitted Encouraged improved compliance at home postdischarge   Asthma Albuterol as needed for wheezing   Alcohol use Patient states he is drinking at least 3-4 alcohol beverages per day 5 days out of the week and then 7-9 beverages per day on the other 2 days.  Although patient is certainly overusing alcohol, no evidence of abuse at this time. Start daily folic acid and thiamine       DVT prophylaxis: scd Code Status:full Family Communication: none at  bedside Disposition Plan:  Status is: Inpatient Remains inpatient appropriate because: iv treatemtn        LOS: 2 days   Time spent: 35 min    Lynn Ito, MD [facility] Pager [phone]  If 7PM-7AM, please contact night-coverage 06/26/2022, 7:44 PM

## 2022-06-26 NOTE — Progress Notes (Signed)
ANTICOAGULATION CONSULT NOTE  Pharmacy Consult for IV heparin Indication: chest pain/ACS  No Known Allergies  Patient Measurements: Height: 5\' 6"  (167.6 cm) Weight: 118.6 kg (261 lb 7.5 oz) IBW/kg (Calculated) : 63.8 Heparin Dosing Weight: 96 kg  Vital Signs: Temp: 98.4 F (36.9 C) (10/11 0000) Temp Source: Oral (10/11 0000) BP: 117/72 (10/11 0200) Pulse Rate: 142 (10/11 0200)  Labs: Recent Labs    06/24/22 1150 06/24/22 1414 06/24/22 1548 06/24/22 2055 06/25/22 0516 06/25/22 1223 06/26/22 0603  HGB 12.7*  --   --   --  12.6*  --  12.7*  HCT 39.8  --   --   --  39.1  --  39.5  PLT 324  --   --   --  341  --  314  APTT  --   --  28  --   --   --   --   LABPROT  --   --  15.2  --   --   --   --   INR  --   --  1.2  --   --   --   --   HEPARINUNFRC  --   --   --    < > 0.66 0.54 0.54  CREATININE 0.94  0.92  --   --   --  1.18  --   --   TROPONINIHS 2,906* 2,727*  --   --   --   --   --    < > = values in this interval not displayed.     Estimated Creatinine Clearance: 104.9 mL/min (by C-G formula based on SCr of 1.18 mg/dL).   Medical History: Past Medical History:  Diagnosis Date   Asthma    Hypertension     Medications:  Heparin Dosing Weight: 96 kg No PTA anticoagulation per my chart review  Assessment: 36 year old male with h/o HTN, uncontrolled T2DM, and OSA presenting with new onset CHF and c/f NSTEMI. Pharmacy consulted for initiation/mgmt of heparin gtt.  Date Time aPTT/HL Rate/Comment 10/10 0516 0.66  Thera x1 10/10 1223 0.54  Thera x2 10/11 0603 0.54  Thera x3; 1450 un/hr      Baseline Labs: aPTT - 28s; INR - 1.2 Hgb - 12.7; Plts - 314  Goal of Therapy:  Heparin level 0.3-0.7 units/ml Monitor platelets by anticoagulation protocol: Yes   Plan:  Heparin level remains consecutively therapeutic  Continue heparin infusion at 1450 units/hr Recheck anti-Xa level once daily while therapeutic Continue to monitor H&H and platelets daily while  on heparin gtt.  Shanon Brow Kharma Sampsel 06/26/2022,6:44 AM

## 2022-06-26 NOTE — Progress Notes (Signed)
Addressed need for scheduled ativan with MD via secure chat. Patient has not exhibited any symptoms of withdrawal as of yet. Ativan has made patient drowsy, requiring need for CPAP use during the day r/t to OSA while sleeping. Of note, there are no orders for CIWA monitoring in place. 1315 dose of ativan not given r/t drowsiness. MD responded they will review chart. No new orders at this time.

## 2022-06-26 NOTE — Consult Note (Addendum)
   Heart Failure Nurse Navigator Note  HFrEF 40 to 45%.  Mild left ventricular dilatation.  Moderate LVH.  Indeterminate diastolic dysfunction.  Right ventricular systolic function is mildly reduced.  Mild right atrial enlargement.  Mild mitral regurgitation.  He presented to the emergency room with complaints of worsening leg swelling and shortness of breath.  Was found to be tachycardic with rates in the 150s was later determined that he was in atrial flutter and suffering from hyperthyroidism.  Troponin level was 2906.  BNP was 91.  Comorbidities:  Atrial flutter Obstructive sleep apnea noncompliant with CPAP Hypertension  Medications:  Diltiazem infusion Folic acid Furosemide 40 mg IV every 12 hours Metoprolol succinate 25 mg 2 times a day PTU 50 mg every 8 hours  Labs: Sodium 137, potassium 3.4, chloride 103, CO2 25, BUN 18, creatinine 1.14, GFR greater than 60, magnesium 2.1, phosphorus 4.6, free T4 is 1.52 and T3 is pending Weight is 119 kg Blood pressure 102/71.  Initial meeting with patient in the ICU.  He is in currently in no acute distress and states that he is feeling much better than when he presented at time of admission.  He is single and lives with his mom works at a [employer] from approximately 3 in the morning till 2 in the afternoon.  He states at home that he had not been compliant with his CPAP machine but now since he has used it here in the hospital he can tell a difference in how he feels when he awakens in the morning and plans to be more compliant.  Discussed heart failure, states that he does not really remember being told anything about the function of his heart.  Went over lifestyle changes that he will need to make with going home.  Doing  daily weights and what to report, fluid and sodium restriction and medications.  He states that at least once or twice that he will eat at restaurants with friends.  Discussed making wise choices  when eating away from home.  He states at home that he does have a scale, discussed being able to monitor his blood pressures.  He states that he does not have a blood pressure cuff but I will supply him with 1 from [facility].  He was given the living with heart failure teaching booklet, zone magnet, info on heart failure and low-sodium along with weight chart.  Also discussed follow-up in the outpatient heart failure clinic for which she has an appointment on October 19 at 4 PM.  He has a 25% no-show ratio which is 1 out of 4 appointments.  He had no further questions at this time and we will continue to follow along.   Pricilla Riffle RN [facility]

## 2022-06-26 NOTE — Progress Notes (Signed)
[facility] for Electrolyte Monitoring and Replacement   Recent Labs: Potassium (mmol/L)  Date Value  06/26/2022 3.4 (L)   Magnesium (mg/dL)  Date Value  06/26/2022 2.1   Calcium (mg/dL)  Date Value  06/26/2022 8.6 (L)   Albumin (g/dL)  Date Value  06/25/2022 3.4 (L)   Phosphorus (mg/dL)  Date Value  06/26/2022 4.6   Sodium (mmol/L)  Date Value  06/26/2022 137    Assessment: 36 year old man admitted with acute biventricular heart failure in the setting of atrial flutter and hyperthyroidism. Pharmacy is asked to review and replace electrolytes  Goal of Therapy:  Potassium 4.0 - 5.1 mmol/L Magnesium 2.0 - 2.4 mg/dL All Other Electrolytes WNL  Plan:  40 mEq po KCl x 1 Recheck electrolytes in am  Dallie Piles ,PharmD Clinical Pharmacist 06/26/2022 7:17 AM

## 2022-06-26 NOTE — Progress Notes (Signed)
NAME:  Arthur Barnes, MRN:  [id number], DOB:  18-May-1986, LOS: 2 ADMISSION DATE:  06/24/2022, CONSULTATION DATE: 06/24/2022 REFERRING MD:  Revonda Humphrey, CHIEF COMPLAINT: Shortness of breath   HPI  36 y.o male with significant PMH of OSA on CPAP,T2DM, asthma, HTN, prior tobacco abuse, and EtOH abuse who presented to the ED with chief complaints of shortness of breath, extremity edema, abdominal distention and orthopnea x1 week.   ED Course: Initial vital signs showed HR of 155 beats/minute, BP 175/135 mm Hg, the RR 18 breaths/minute, and the oxygen saturation 98% on RA and a temperature of 98.F (36.7C).   Pertinent Labs/Diagnostics Findings: Chemistry: Unremarkable CBC: Unremarkable Other Lab findings: TSH <0.010, Free T3 pending, Free T4  1.34, Lactic acid: 1.2 COVID PCR: Negative, Troponin: 2906, BNP: 91 Imaging:  CXR> cardiomegaly questionable pericardial effusion probable CHF/volume overload  EKG shows narrow complex tachycardia, patient received 6 mg of IV adenosine push, second dose 12 mg IV adenosine push without improvement.  Cardiology was consulted who reviewed EKG and thought to be consistent with atrial flutter with 2-1 AV block with RBBB likely in the setting of uncontrolled hyperthyroidism.  Patient also received IV Lasix x1 dose and IV diltiazem without improvement therefore was started on oral and IV metoprolol by cardiology.  He was also started on heparin drip for elevated troponin.  Patient admitted to hospitalist service.  Due to persistent tachycardia with high risk for decompensation, PCCM was consulted.  06/26/22- patient is not tachyarrythmic and feels less dyspneic.  His risk for intubation is now improved and he is not critical at this time. PCCM will sign off and available when desired.   Past Medical History  OSA on CPAP,T2DM, asthma, HTN, prior tobacco abuse, and EtOH abuse   Significant Hospital Events   10/9: Admitted to stepdown with new onset  Afib/Aflutter in the setting of uncontrolled hyperthyroidism and probable new onset CHF. PCCM consulted.  Consults:  Cardiology PCCM  Procedures:  None  Significant Diagnostic Tests:  10/9: Chest Xray>1. Cardiomegaly, questionably enlarged compared to the previous exams. Pericardial effusion can not be excluded. 2. Probable CHF/volume overload.   Micro Data:  10/9: SARS-CoV-2 PCR> negative 10/9: Influenza PCR> negative 10/9: Blood culture x2> 10/9: Urine Culture> 10/9: MRSA PCR>>   Antimicrobials:  None  OBJECTIVE  Blood pressure 129/77, pulse (!) 51, temperature 98.1 F (36.7 C), temperature source Oral, resp. rate (!) 23, height 5\' 6"  (1.676 m), weight 119 kg, SpO2 (!) 88 %.        Intake/Output Summary (Last 24 hours) at 06/26/2022 0951 Last data filed at 06/26/2022 0934 Gross per 24 hour  Intake 1003.06 ml  Output 1450 ml  Net -446.94 ml    Filed Weights   06/24/22 1423 06/25/22 0500 06/26/22 0500  Weight: 133.8 kg 118.6 kg 119 kg    Physical Examination  GENERAL: 36 year-old critically ill patient lying in the bed with no acute distress.  EYES: Pupils equal, round, reactive to light and accommodation. No scleral icterus. Extraocular muscles intact.  HEENT: Head atraumatic, normocephalic. Oropharynx and nasopharynx clear.  NECK:  Supple, no jugular venous distention. No thyroid enlargement, no tenderness.  LUNGS: Decreased breath sounds bilaterally, no wheezing, rales,rhonchi or crepitation. Mild use of accessory muscles of respiration.  CARDIOVASCULAR: Irregular. No murmurs, rubs, or gallops.  ABDOMEN: Soft, nontender, nondistended. Bowel sounds present. No organomegaly or mass.  EXTREMITIES: Bilateral lower extremities edema 2+ edema,  cyanosis, or clubbing.  NEUROLOGIC: Cranial nerves II through  XII are intact.  Muscle strength 5/5 in all extremities. Sensation intact. Gait not checked.  PSYCHIATRIC: The patient is alert and oriented x 3.  SKIN: No  obvious rash, lesion, or ulcer.   Labs/imaging that I havepersonally reviewed  (right click and "Reselect all SmartList Selections" daily)     Labs   CBC: Recent Labs  Lab 06/24/22 1150 06/25/22 0516 06/26/22 0603  WBC 8.3 9.8 8.6  HGB 12.7* 12.6* 12.7*  HCT 39.8 39.1 39.5  MCV 90.9 90.5 91.4  PLT 324 341 314     Basic Metabolic Panel: Recent Labs  Lab 06/24/22 1150 06/24/22 1506 06/25/22 0516 06/26/22 0603  NA 137  139  --  134* 137  K 3.8  3.8  --  4.5 3.4*  CL 102  102  --  101 103  CO2 26  25  --  25 25  GLUCOSE 96  100*  --  148* 130*  BUN 7  7  --  19 18  CREATININE 0.94  0.92  --  1.18 1.14  CALCIUM 8.7*  9.0  --  9.1 8.6*  MG  --  2.0 2.0 2.1  PHOS  --   --  4.4 4.6    GFR: Estimated Creatinine Clearance: 108.8 mL/min (by C-G formula based on SCr of 1.14 mg/dL). Recent Labs  Lab 06/24/22 1150 06/24/22 1950 06/25/22 0516 06/26/22 0603  PROCALCITON  --  <0.10 <0.10 <0.10  WBC 8.3  --  9.8 8.6  LATICACIDVEN  --  1.2  --   --      Liver Function Tests: Recent Labs  Lab 06/24/22 1150 06/25/22 0516  AST 29 24  ALT 35 31  ALKPHOS 67 65  BILITOT 0.8 1.0  PROT 7.3 7.8  ALBUMIN 3.4* 3.4*    No results for input(s): "LIPASE", "AMYLASE" in the last 168 hours. No results for input(s): "AMMONIA" in the last 168 hours.  ABG No results found for: "PHART", "PCO2ART", "PO2ART", "HCO3", "TCO2", "ACIDBASEDEF", "O2SAT"   Coagulation Profile: Recent Labs  Lab 06/24/22 1548  INR 1.2     Cardiac Enzymes: No results for input(s): "CKTOTAL", "CKMB", "CKMBINDEX", "TROPONINI" in the last 168 hours.  HbA1C: Hgb A1c MFr Bld  Date/Time Value Ref Range Status  06/24/2022 03:06 PM 6.5 (H) 4.8 - 5.6 % Final    Comment:    (NOTE) Pre diabetes:          5.7%-6.4%  Diabetes:              >6.4%  Glycemic control for   <7.0% adults with diabetes     CBG: Recent Labs  Lab 06/25/22 0741 06/25/22 1114 06/25/22 1645 06/25/22 2156  06/26/22 0747  GLUCAP 126* 147* 148* 174* 157*     Review of Systems:   10 point ROS done and is negative except as per HPI Past Medical History  He,  has a past medical history of Asthma and Hypertension.   Surgical History   History reviewed. No pertinent surgical history.   Social History   reports that he has been smoking. He has never used smokeless tobacco. He reports current alcohol use. He reports that he does not use drugs.   Family History   His family history includes Hypertension in his mother; Thyroid disease in his maternal aunt.   Allergies No Known Allergies   Home Medications  Prior to Admission medications   Not on File  Scheduled Meds:  atenolol  25 mg Oral BID  Chlorhexidine Gluconate Cloth  6 each Topical Q0600   cholestyramine light  4 g Oral TID   folic acid  1 mg Oral Daily   furosemide  40 mg Intravenous Q12H   insulin aspart  0-20 Units Subcutaneous TID WC   LORazepam  1 mg Intravenous Q3H   multivitamin with minerals  1 tablet Oral Daily   potassium chloride  40 mEq Oral Once   propylthiouracil  50 mg Oral Q8H   sodium chloride flush  3 mL Intravenous Q12H   thiamine  100 mg Oral Daily   Or   thiamine  100 mg Intravenous Daily   Continuous Infusions:  diltiazem (CARDIZEM) infusion 10 mg/hr (06/26/22 0930)   heparin 1,450 Units/hr (06/26/22 0934)   PRN Meds:.acetaminophen **OR** acetaminophen, albuterol, metoprolol tartrate, mouth rinse, polyethylene glycol     Assessment & Plan:   Thyroid Crisis TSH: <0.0, Free T4: 1.34 -Blood cultures & infectious workup as indicated -Hold abx for now although have low suspicion for infectious process -Continue Methimazole 20 mg Q4hr -Start hydrocortisone 300 mg IV x 1 dose then maintenance dose of 100 mg IV Q8hr. -Continue Metoprolol 25 mg Q6hr -Due to concerns for CHF, may require permissive tachycardia to achieve adequate perfusion with targeting a heart rate below ~120 b/m  -Avoid  salicylates or NSAIDs, may increase free thyroid hormone levels. -Gentle hydration with PRN Vasopressor (phenylephrine) for MAP goal>65  AFib+RVR, Likely provoked in the setting of uncontrolled hyperthyroidism Elevated Troponin likely demand ischemia in the setting of above -Serial EKGs -Trend Troponins -TTEcho -Diltiazem 0.25mg /kg (15mg ) IV x1 then maintain 5-15mg /hr drip -Metoprolol 2.5-5mg  IV up to x3 then PO maintenance -continue Heparin as above -Keep NPO for option of TEEcho + DCCardioversion -Cardiology Consult, input appreciated  Acute Decompensated CHF PMHx: OSA on CPAP, HTN, Asthma, Prior tobacco use -Supplemental O2 as needed to maintain O2 saturations 88 to 92% -Continuous cardiac monitoring -Maintain MAP greater than 65 -IV Lasix as blood pressure and renal function permits; currently on Lasix 40 mg IV BID -Cardiology following, appreciate input -Repeat 2D Echocardiogram  EtOH Abuse -Follow CMP, INR, Daily BMP+Mg -CIWA symptom triggered PRNs -Daily Thiamine, Folate, MVI once tolerating PO -SW consult for cessation resources -PT/OT evaluation for mobility   T2 Diabetes mellitus HgbA1c 6.5 -CBG's AC & hs; Target range of 140 to 180 -SSI -Follow ICU Hypo/Hyperglycemia protocol    Best practice:  Diet:  NPO Pain/Anxiety/Delirium protocol (if indicated): Yes (RASS goal 0) VAP protocol (if indicated): Not indicated DVT prophylaxis: Systemic AC GI prophylaxis: N/A Glucose control:  SSI Yes Central venous access:  N/A Arterial line:  N/A Foley:  N/A Mobility:  bed rest  PT consulted: N/A Last date of multidisciplinary goals of care discussion [06/24/22] Code Status:  full code Disposition: ICU   = Goals of Care = Code Status Order: FULL  Primary Emergency Contact: Edberg,NANCY L Wishes to pursue full aggressive treatment and intervention options, including CPR and intubation, but goals of care will be addressed on going with family if that should become  necessary.  Critical care provider statement:   Total critical care time: 33 minutes   Performed by: 08/24/22 MD   Critical care time was exclusive of separately billable procedures and treating other patients.   Critical care was necessary to treat or prevent imminent or life-threatening deterioration.   Critical care was time spent personally by me on the following activities: development of treatment plan with patient and/or surrogate as well as nursing, discussions with  consultants, evaluation of patient's response to treatment, examination of patient, obtaining history from patient or surrogate, ordering and performing treatments and interventions, ordering and review of laboratory studies, ordering and review of radiographic studies, pulse oximetry and re-evaluation of patient's condition.    Vida Rigger, M.D.  Pulmonary & Critical Care Medicine

## 2022-06-26 NOTE — Progress Notes (Signed)
Rounding Note    Patient Name: Arthur Barnes Date of Encounter: 06/26/2022  [facility] Cardiologist: None   New consult done by Dr. Kirke Corin  Subjective   Patient seen on AM rounds. Sitting upright in bed eating breakfast. Heart rates better controled with atrial flutter with rates in 80-90's. Denies any chest pain or shortness of breath. Continues on diltiazem drip and heparin drip.  Inpatient Medications    Scheduled Meds:  atenolol  25 mg Oral BID   Chlorhexidine Gluconate Cloth  6 each Topical Q0600   cholestyramine light  4 g Oral TID   folic acid  1 mg Oral Daily   furosemide  40 mg Intravenous Q12H   insulin aspart  0-20 Units Subcutaneous TID WC   LORazepam  1 mg Intravenous Q3H   multivitamin with minerals  1 tablet Oral Daily   potassium chloride  40 mEq Oral Once   propylthiouracil  50 mg Oral Q8H   sodium chloride flush  3 mL Intravenous Q12H   thiamine  100 mg Oral Daily   Or   thiamine  100 mg Intravenous Daily   Continuous Infusions:  diltiazem (CARDIZEM) infusion 15 mg/hr (06/26/22 0721)   heparin 1,450 Units/hr (06/26/22 0700)   PRN Meds: acetaminophen **OR** acetaminophen, albuterol, metoprolol tartrate, mouth rinse, polyethylene glycol   Vital Signs    Vitals:   06/26/22 0735 06/26/22 0740 06/26/22 0745 06/26/22 0800  BP: (!) 117/56  107/65 106/74  Pulse: (!) 58 (!) 59 (!) 59 60  Resp: (!) 21 14 (!) 23 (!) 38  Temp:  98.1 F (36.7 C)    TempSrc:  Oral    SpO2: 95% 95% 95% 93%  Weight:      Height:        Intake/Output Summary (Last 24 hours) at 06/26/2022 0812 Last data filed at 06/26/2022 0700 Gross per 24 hour  Intake 605.89 ml  Output 1450 ml  Net -844.11 ml      06/26/2022    5:00 AM 06/25/2022    5:00 AM 06/24/2022    2:23 PM  Last 3 Weights  Weight (lbs) 262 lb 5.6 oz 261 lb 7.5 oz 295 lb  Weight (kg) 119 kg 118.6 kg 133.811 kg      Telemetry    Atrial flutter with rates 80-90 starting around 7:47 this  morning   - Personally Reviewed  ECG    No new tracings - Personally Reviewed  Physical Exam   GEN: No acute distress. Sitting upright in bed eating breakfast. Neck: No JVD appreciated Cardiac: irregular, no murmurs, rubs, or gallops.  Respiratory: Clear to auscultation bilaterally. Respirations are unlabored on room air GI: Soft, nontender, non-distended  MS: 1+ edema to the BLE; No deformity. Neuro:  Nonfocal  Psych: Normal affect   Labs    High Sensitivity Troponin:   Recent Labs  Lab 06/24/22 1150 06/24/22 1414  TROPONINIHS 2,906* 2,727*     Chemistry Recent Labs  Lab 06/24/22 1150 06/24/22 1506 06/25/22 0516 06/26/22 0603  NA 137  139  --  134* 137  K 3.8  3.8  --  4.5 3.4*  CL 102  102  --  101 103  CO2 26  25  --  25 25  GLUCOSE 96  100*  --  148* 130*  BUN 7  7  --  19 18  CREATININE 0.94  0.92  --  1.18 1.14  CALCIUM 8.7*  9.0  --  9.1 8.6*  MG  --  2.0 2.0 2.1  PROT 7.3  --  7.8  --   ALBUMIN 3.4*  --  3.4*  --   AST 29  --  24  --   ALT 35  --  31  --   ALKPHOS 67  --  65  --   BILITOT 0.8  --  1.0  --   GFRNONAA >60  >60  --  >60 >60  ANIONGAP 9  12  --  8 9    Lipids  Recent Labs  Lab 06/24/22 1506  CHOL 113  TRIG 71  HDL 35*  LDLCALC 64  CHOLHDL 3.2    Hematology Recent Labs  Lab 06/24/22 1150 06/25/22 0516 06/26/22 0603  WBC 8.3 9.8 8.6  RBC 4.38 4.32 4.32  HGB 12.7* 12.6* 12.7*  HCT 39.8 39.1 39.5  MCV 90.9 90.5 91.4  MCH 29.0 29.2 29.4  MCHC 31.9 32.2 32.2  RDW 14.4 14.2 14.0  PLT 324 341 314   Thyroid  Recent Labs  Lab 06/24/22 1506 06/24/22 1612 06/26/22 0603  TSH <0.010*  --   --   FREET4  --    < > 1.52*   < > = values in this interval not displayed.    BNP Recent Labs  Lab 06/24/22 1150  BNP 91.0    DDimer No results for input(s): "DDIMER" in the last 168 hours.   Radiology    US THYROID  Result Date: 06/25/2022 CLINICAL DATA:  Thyroid storm EXAM: THYROID ULTRASOUND TECHNIQUE:  Ultrasound examination of the thyroid gland and adjacent soft tissues was performed. COMPARISON:  None Available. FINDINGS: Parenchymal Echotexture: Moderately heterogenous Isthmus: 1.1 cm Right lobe: 5.2 x 2.1 x 2.2 cm Left lobe: 4.5 x 2.2 x 2.1 cm _________________________________________________________ Estimated total number of nodules >/= 1 cm: 0 Number of spongiform nodules >/=  2 cm not described below (TR1): 0 Number of mixed cystic and solid nodules >/= 1.5 cm not described below (TR2): 0 _________________________________________________________ Mildly enlarged and heterogeneous thyroid gland, nonspecific for medical thyroid disease. No significant hypervascularity. Negative for nodule or focal abnormality. No regional adenopathy. IMPRESSION: Mildly enlarged heterogeneous thyroid compatible with medical thyroid disease. Negative for nodule. The above is in keeping with the ACR TI-RADS recommendations - J Am Coll Radiol 2017;14:587-595. Electronically Signed   By: Judie Petit.  Shick M.D.   On: 06/25/2022 15:52   ECHOCARDIOGRAM COMPLETE  Result Date: 06/25/2022    ECHOCARDIOGRAM REPORT   Patient Name:   Arthur Barnes Date of Exam: 06/25/2022 Medical Rec #:  [id number]      Height:       66.0 in Accession #:    [id number]     Weight:       261.5 lb Date of Birth:  1985-11-28      BSA:          2.241 m Patient Age:    36 years       BP:           137/117 mmHg Patient Gender: M              HR:           73 bpm. Exam Location:  [facility] Procedure: 2D Echo, Cardiac Doppler, Color Doppler and Intracardiac            Opacification Agent Indications:     NSTEMI I21.4  History:         Patient has no prior history of  Echocardiogram examinations.                  Risk Factors:Current Smoker and Hypertension. Asthma.  Sonographer:     Cristela Blue Referring Phys:  [id number] Raymon Mutton DUNN Diagnosing Phys: Yvonne Kendall MD  Sonographer Comments: Suboptimal apical window. IMPRESSIONS  1. Left ventricular ejection fraction, by  estimation, is 40 to 45%. The left ventricle has moderately decreased function. The left ventricle demonstrates global hypokinesis. The left ventricular internal cavity size was mildly dilated. There is moderate  left ventricular hypertrophy. Left ventricular diastolic parameters are indeterminate.  2. Right ventricular systolic function is mildly reduced. The right ventricular size is mildly enlarged. There is normal pulmonary artery systolic pressure.  3. Right atrial size was mildly dilated.  4. The mitral valve is normal in structure. Mild mitral valve regurgitation.  5. The aortic valve was not well visualized. Aortic valve regurgitation is not visualized. No aortic stenosis is present.  6. The inferior vena cava is dilated in size with <50% respiratory variability, suggesting right atrial pressure of 15 mmHg. FINDINGS  Left Ventricle: Left ventricular ejection fraction, by estimation, is 40 to 45%. The left ventricle has moderately decreased function. The left ventricle demonstrates global hypokinesis. Definity contrast agent was given IV to delineate the left ventricular endocardial borders. The left ventricular internal cavity size was mildly dilated. There is moderate left ventricular hypertrophy. Left ventricular diastolic parameters are indeterminate. Right Ventricle: The right ventricular size is mildly enlarged. No increase in right ventricular wall thickness. Right ventricular systolic function is mildly reduced. There is normal pulmonary artery systolic pressure. The tricuspid regurgitant velocity  is 2.13 m/s, and with an assumed right atrial pressure of 15 mmHg, the estimated right ventricular systolic pressure is 33.1 mmHg. Left Atrium: Left atrial size was normal in size. Right Atrium: Right atrial size was mildly dilated. Pericardium: There is no evidence of pericardial effusion. Mitral Valve: The mitral valve is normal in structure. Mild mitral valve regurgitation. Tricuspid Valve: The tricuspid  valve is normal in structure. Tricuspid valve regurgitation is mild. Aortic Valve: The aortic valve was not well visualized. Aortic valve regurgitation is not visualized. No aortic stenosis is present. Aortic valve mean gradient measures 3.0 mmHg. Aortic valve peak gradient measures 4.5 mmHg. Aortic valve area, by VTI measures 2.97 cm. Pulmonic Valve: The pulmonic valve was normal in structure. Pulmonic valve regurgitation is trivial. No evidence of pulmonic stenosis. Aorta: The aortic root is normal in size and structure. Pulmonary Artery: The pulmonary artery is of normal size. Venous: The inferior vena cava is dilated in size with less than 50% respiratory variability, suggesting right atrial pressure of 15 mmHg. IAS/Shunts: The interatrial septum was not well visualized.  LEFT VENTRICLE PLAX 2D LVIDd:         5.80 cm LVIDs:         3.60 cm LV PW:         1.40 cm LV IVS:        1.30 cm LVOT diam:     2.20 cm LV SV:         57 LV SV Index:   25 LVOT Area:     3.80 cm  RIGHT VENTRICLE RV Basal diam:  4.20 cm RV Mid diam:    3.30 cm LEFT ATRIUM             Index        RIGHT ATRIUM  Index LA diam:        3.80 cm 1.70 cm/m   RA Area:     18.10 cm LA Vol (A2C):   46.2 ml 20.62 ml/m  RA Volume:   53.90 ml  24.05 ml/m LA Vol (A4C):   47.1 ml 21.02 ml/m LA Biplane Vol: 47.4 ml 21.15 ml/m  AORTIC VALVE AV Area (Vmax):    2.97 cm AV Area (Vmean):   2.44 cm AV Area (VTI):     2.97 cm AV Vmax:           106.00 cm/s AV Vmean:          80.200 cm/s AV VTI:            0.191 m AV Peak Grad:      4.5 mmHg AV Mean Grad:      3.0 mmHg LVOT Vmax:         82.80 cm/s LVOT Vmean:        51.400 cm/s LVOT VTI:          0.149 m LVOT/AV VTI ratio: 0.78  AORTA Ao Root diam: 3.60 cm MITRAL VALVE                TRICUSPID VALVE MV Area (PHT): 12.04 cm    TR Peak grad:   18.1 mmHg MV Decel Time: 63 msec      TR Vmax:        213.00 cm/s MV E velocity: 72.30 cm/s MV A velocity: 108.00 cm/s  SHUNTS MV E/A ratio:  0.67          Systemic VTI:  0.15 m                             Systemic Diam: 2.20 cm Nelva Bush MD Electronically signed by Nelva Bush MD Signature Date/Time: 06/25/2022/11:00:10 AM    Final    DG Chest 2 View  Result Date: 06/24/2022 CLINICAL DATA:  Shortness of breath.  Labored breathing. EXAM: CHEST - 2 VIEW COMPARISON:  Chest x-rays dated 07/10/2021 and 12/27/2018. FINDINGS: Cardiomegaly, questionably enlarged compared to the previous exams. There is central pulmonary vascular congestion. No confluence opacity to suggest a consolidating pneumonia. Osseous structures about the chest are unremarkable. IMPRESSION: 1. Cardiomegaly, questionably enlarged compared to the previous exams. Pericardial effusion can not be excluded. 2. Probable CHF/volume overload. Electronically Signed   By: Franki Cabot M.D.   On: 06/24/2022 12:28    Cardiac Studies  TTE 06/25/22 1. Left ventricular ejection fraction, by estimation, is 40 to 45%. The  left ventricle has moderately decreased function. The left ventricle  demonstrates global hypokinesis. The left ventricular internal cavity size  was mildly dilated. There is moderate   left ventricular hypertrophy. Left ventricular diastolic parameters are  indeterminate.   2. Right ventricular systolic function is mildly reduced. The right  ventricular size is mildly enlarged. There is normal pulmonary artery  systolic pressure.   3. Right atrial size was mildly dilated.   4. The mitral valve is normal in structure. Mild mitral valve  regurgitation.   5. The aortic valve was not well visualized. Aortic valve regurgitation  is not visualized. No aortic stenosis is present.   6. The inferior vena cava is dilated in size with <50% respiratory  variability, suggesting right atrial pressure of 15 mmHg.   Patient Profile     36 y.o. male with a history of HTN, asthma, obesity, and  OSA on CPAP, who is being seen and evaluated for new onset CHF.   Assessment & Plan     Newly diagnosed atrial flutter with variable AV block and RVR -IV adenosine pushed in the emergency department at 6 mg and 12 mg without breaking or slowing rhythm/rate -continued on diltiazem drip -atrial tachycardia in the setting of hyperthyroidism and undectable TSH and elevated free T4 -continue toprol xl 25 mg bid -continue heparin drip -continue cardiac monitor -once thyroid is under control and if remains in atrial flutter can consider DCCV -IV amiodarone contraindicated in the setting of acute hyperthyroidism -recommend keeping potassium level closer to 4 and mg at 2  Acute HFrEF -LVEF 40-45% -likely reduction mediated by tachycardia, can not exclude ischemic heart disease -continue furosemide 40 mg IVP BID - -916.6 output in 24 hours -escalate GDMT as tolerated, blood pressure is improving -continue on toprol XL 25 mg bid -daily weight, I&O, low sodium diet  Elevated high sensistivity troponins -remains chest pain free -possibly secondary to supply/demand mismatch ischemia but can not exclude underlying CAD -initial and peak high sensitivity troponin 2906 -continued on heparin drip  -continue asa 81 mg daily -Will need R/LHC prior to discharge once ventricular rates have improved and adequate diuresis  OSA -continue cpap  Alcohol use -continue ciwa protocol  Hyperthyroidism -likely the driving etiology of his new onset atrial flutter with RVR -started on PTU  -management per IM  7.    Hypokalemia -serum potassium 3.4 -recommend level of 4 -supplemented this morning -daily BMP -monitor/trend/replete electrolytes as needed     For questions or updates, please contact [facility] Please consult [email or web address] for contact info under        Signed, Alberto Pina, NP  06/26/2022, 8:12 AM

## 2022-06-27 ENCOUNTER — Other Ambulatory Visit (HOSPITAL_COMMUNITY): Payer: Self-pay

## 2022-06-27 DIAGNOSIS — J810 Acute pulmonary edema: Secondary | ICD-10-CM | POA: Diagnosis not present

## 2022-06-27 DIAGNOSIS — I420 Dilated cardiomyopathy: Secondary | ICD-10-CM

## 2022-06-27 DIAGNOSIS — Z789 Other specified health status: Secondary | ICD-10-CM | POA: Diagnosis not present

## 2022-06-27 DIAGNOSIS — J9601 Acute respiratory failure with hypoxia: Secondary | ICD-10-CM

## 2022-06-27 DIAGNOSIS — I483 Typical atrial flutter: Secondary | ICD-10-CM

## 2022-06-27 DIAGNOSIS — I214 Non-ST elevation (NSTEMI) myocardial infarction: Secondary | ICD-10-CM | POA: Diagnosis not present

## 2022-06-27 DIAGNOSIS — I10 Essential (primary) hypertension: Secondary | ICD-10-CM

## 2022-06-27 LAB — BASIC METABOLIC PANEL
Anion gap: 5 (ref 5–15)
BUN: 13 mg/dL (ref 6–20)
CO2: 26 mmol/L (ref 22–32)
Calcium: 8.6 mg/dL — ABNORMAL LOW (ref 8.9–10.3)
Chloride: 106 mmol/L (ref 98–111)
Creatinine, Ser: 1.01 mg/dL (ref 0.61–1.24)
GFR, Estimated: >60 mL/min (ref >60)
Glucose, Bld: 99 mg/dL (ref 70–99)
Potassium: 3.8 mmol/L (ref 3.5–5.1)
Sodium: 137 mmol/L (ref 135–145)

## 2022-06-27 LAB — MAGNESIUM: Magnesium: 2.2 mg/dL (ref 1.7–2.4)

## 2022-06-27 LAB — PHOSPHORUS: Phosphorus: 3.7 mg/dL (ref 2.5–4.6)

## 2022-06-27 LAB — GLUCOSE, CAPILLARY
Glucose-Capillary: 95 mg/dL (ref 70–99)
Glucose-Capillary: 153 mg/dL — ABNORMAL HIGH (ref 70–99)
Glucose-Capillary: 168 mg/dL — ABNORMAL HIGH (ref 70–99)
Glucose-Capillary: 87 mg/dL (ref 70–99)

## 2022-06-27 LAB — HEPARIN LEVEL (UNFRACTIONATED): Heparin Unfractionated: 0.49 IU/mL (ref 0.30–0.70)

## 2022-06-27 MED ORDER — APIXABAN 5 MG PO TABS
5.0000 mg | ORAL_TABLET | Freq: Two times a day (BID) | ORAL | Status: DC
  Administered 2022-06-27 – 2022-06-30 (×7): 5 mg via ORAL
  Filled 2022-06-27 (×7): qty 1

## 2022-06-27 MED ORDER — METOPROLOL SUCCINATE ER 50 MG PO TB24
100.0000 mg | ORAL_TABLET | Freq: Two times a day (BID) | ORAL | Status: DC
  Administered 2022-06-27 – 2022-06-30 (×7): 100 mg via ORAL
  Filled 2022-06-27 (×7): qty 2

## 2022-06-27 MED ORDER — POTASSIUM CHLORIDE CRYS ER 20 MEQ PO TBCR
40.0000 meq | EXTENDED_RELEASE_TABLET | Freq: Once | ORAL | Status: AC
  Administered 2022-06-27: 40 meq via ORAL
  Filled 2022-06-27: qty 2

## 2022-06-27 NOTE — Progress Notes (Signed)
Cardiology Progress Note   Patient Name: Arthur Barnes Date of Encounter: 06/27/2022  Primary Cardiologist: Kathlyn Sacramento, MD  Subjective    Breathing improved.  HRs trending in 90's - remains in afib.  No chest pain.  Inpatient Medications    Scheduled Meds:  apixaban  5 mg Oral BID   Chlorhexidine Gluconate Cloth  6 each Topical Q0600   cholestyramine light  4 g Oral TID   folic acid  1 mg Oral Daily   furosemide  40 mg Intravenous Q12H   insulin aspart  0-20 Units Subcutaneous TID WC   metoprolol succinate  100 mg Oral BID   multivitamin with minerals  1 tablet Oral Daily   propylthiouracil  50 mg Oral Q8H   sodium chloride flush  3 mL Intravenous Q12H   thiamine  100 mg Oral Daily   Or   thiamine  100 mg Intravenous Daily   Continuous Infusions:   PRN Meds: acetaminophen **OR** acetaminophen, albuterol, metoprolol tartrate, mouth rinse, polyethylene glycol   Vital Signs    Vitals:   06/27/22 0700 06/27/22 0726 06/27/22 0727 06/27/22 0800  BP: 122/89   130/70  Pulse: 74  74 65  Resp: (!) 24  (!) 30 (!) 29  Temp:  97.7 F (36.5 C)    TempSrc:  Axillary    SpO2: 95%  97% 95%  Weight:      Height:        Intake/Output Summary (Last 24 hours) at 06/27/2022 0945 Last data filed at 06/27/2022 0727 Gross per 24 hour  Intake 1513.64 ml  Output 3750 ml  Net -2236.36 ml   Filed Weights   06/25/22 0500 06/26/22 0500 06/27/22 0438  Weight: 118.6 kg 119 kg 117.1 kg    Physical Exam   GEN: Obese, in no acute distress.  HEENT: Grossly normal.  Neck: Supple, no carotid bruits or masses.  Body habitus makes gauging JVP difficult. Cardiac: IR, IR, tachy, no murmurs, rubs, or gallops. No clubbing, cyanosis, trace to 1+ bilat LE edema.  Radials 2+, DP/PT 2+ and equal bilaterally.  Respiratory:  Respirations regular and unlabored, clear to auscultation bilaterally. GI: Obese, soft, nontender, nondistended, BS + x 4. MS: no deformity or atrophy. Skin: warm and  dry, no rash. Neuro:  Strength and sensation are intact. Psych: AAOx3.  Normal affect.  Labs    Chemistry Recent Labs  Lab 06/24/22 1150 06/25/22 0516 06/26/22 0603 06/27/22 0722  NA 137  139 134* 137 137  K 3.8  3.8 4.5 3.4* 3.8  CL 102  102 101 103 106  CO2 26  25 25 25 26   GLUCOSE 96  100* 148* 130* 99  BUN 7  7 19 18 13   CREATININE 0.94  0.92 1.18 1.14 1.01  CALCIUM 8.7*  9.0 9.1 8.6* 8.6*  PROT 7.3 7.8  --   --   ALBUMIN 3.4* 3.4*  --   --   AST 29 24  --   --   ALT 35 31  --   --   ALKPHOS 67 65  --   --   BILITOT 0.8 1.0  --   --   GFRNONAA >60  >60 >60 >60 >60  ANIONGAP 9  12 8 9 5      Hematology Recent Labs  Lab 06/24/22 1150 06/25/22 0516 06/26/22 0603  WBC 8.3 9.8 8.6  RBC 4.38 4.32 4.32  HGB 12.7* 12.6* 12.7*  HCT 39.8 39.1 39.5  MCV 90.9 90.5  91.4  MCH 29.0 29.2 29.4  MCHC 31.9 32.2 32.2  RDW 14.4 14.2 14.0  PLT 324 341 314    Cardiac Enzymes  Recent Labs  Lab 06/24/22 1150 06/24/22 1414  TROPONINIHS 2,906* 2,727*      BNP    Component Value Date/Time   BNP 91.0 06/24/2022 1150    Lipids  Lab Results  Component Value Date   CHOL 113 06/24/2022   HDL 35 (L) 06/24/2022   LDLCALC 64 06/24/2022   TRIG 71 06/24/2022   CHOLHDL 3.2 06/24/2022    HbA1c  Lab Results  Component Value Date   HGBA1C 6.5 (H) 06/24/2022    Radiology    US THYROID  Result Date: 06/25/2022 CLINICAL DATA:  Thyroid storm EXAM: THYROID ULTRASOUND TECHNIQUE: Ultrasound examination of the thyroid gland and adjacent soft tissues was performed. COMPARISON:  None Available. FINDINGS: Parenchymal Echotexture: Moderately heterogenous Isthmus: 1.1 cm Right lobe: 5.2 x 2.1 x 2.2 cm Left lobe: 4.5 x 2.2 x 2.1 cm _________________________________________________________ Estimated total number of nodules >/= 1 cm: 0 Number of spongiform nodules >/=  2 cm not described below (TR1): 0 Number of mixed cystic and solid nodules >/= 1.5 cm not described below  (TR2): 0 _________________________________________________________ Mildly enlarged and heterogeneous thyroid gland, nonspecific for medical thyroid disease. No significant hypervascularity. Negative for nodule or focal abnormality. No regional adenopathy. IMPRESSION: Mildly enlarged heterogeneous thyroid compatible with medical thyroid disease. Negative for nodule. The above is in keeping with the ACR TI-RADS recommendations - J Am Coll Radiol 2017;14:587-595. Electronically Signed   By: Judie Petit.  Shick M.D.   On: 06/25/2022 15:52   DG Chest 2 View  Result Date: 06/24/2022 CLINICAL DATA:  Shortness of breath.  Labored breathing. EXAM: CHEST - 2 VIEW COMPARISON:  Chest x-rays dated 07/10/2021 and 12/27/2018. FINDINGS: Cardiomegaly, questionably enlarged compared to the previous exams. There is central pulmonary vascular congestion. No confluence opacity to suggest a consolidating pneumonia. Osseous structures about the chest are unremarkable. IMPRESSION: 1. Cardiomegaly, questionably enlarged compared to the previous exams. Pericardial effusion can not be excluded. 2. Probable CHF/volume overload. Electronically Signed   By: Bary Richard M.D.   On: 06/24/2022 12:28    Telemetry    Afib, 90's to low 100's - Personally Reviewed  Cardiac Studies   2D Echocardiogram 10.1.2023   1. Left ventricular ejection fraction, by estimation, is 40 to 45%. The  left ventricle has moderately decreased function. The left ventricle  demonstrates global hypokinesis. The left ventricular internal cavity size  was mildly dilated. There is moderate   left ventricular hypertrophy. Left ventricular diastolic parameters are  indeterminate.   2. Right ventricular systolic function is mildly reduced. The right  ventricular size is mildly enlarged. There is normal pulmonary artery  systolic pressure.   3. Right atrial size was mildly dilated.   4. The mitral valve is normal in structure. Mild mitral valve  regurgitation.    5. The aortic valve was not well visualized. Aortic valve regurgitation  is not visualized. No aortic stenosis is present.   6. The inferior vena cava is dilated in size with <50% respiratory  variability, suggesting right atrial pressure of 15 mmHg.  _____________  Patient Profile     36 y.o. male with a history of HTN, asthma, obesity, and OSA on CPAP, who was admitted 10/9 w/ CHF, Afib w/ RVR, and hyperthyroidism.  HsTrop 2906.  Assessment & Plan    1. Afib w/ RVR:  In setting of hyperthyroidism.  Rates improved on ? blocker and IV dilt.  Will titrate ? blocker and d/c dilt in setting of reduced EF.  D/c heparin and transition to eliquis 5mg  bid.  Plan to cont rate control until euthyroid at least after 3 wks of OAC, at which point, if he remains in afib, we can consider DCCV in the outpt setting.  2.  Hyperthyroidism:  On PTU.  Avoid iodine containing meds and contrast.  3.  Acute HFmrEF/Cardiomyopathy:  In setting of above, volume overloaded on arrival w/ reduced EF on echo - 40-45%.  HsTrop 2906 - ? Demand ischemia and ICM vs tachy-mediated.  He has responded well to IV diuresis but remains at least mildly volume overloaded (body habitus makes exam challenging).  Notes improved breathing and reduced abd girth.  Still w/ trace to 1+ lower ext edema.  Minus 1.1L overnight and minus 3.4L since admission.  Wt down another 1.9 kg to 117.1 kg.  Prev 99kg in 06/2021.  Does not appear to still be holding 18 kg of fluid. Cont IV diuresis.  Transitioning ? blocker to toprol xl.  Will add low dose ARB and consider transitioning to entresto as bp allows.  Consider SGLT2i - caution w/ h/o ETOH.  4.  Supply/Demand Ischemia:  In setting of above, HsTrop up to 2906.  No c/p.  Echo w/ EF 40-45% and glob HK.  Suspect tachy-mediated but will need eventual ischemic eval.  Defer until euthyroid.  Body habitus likely to make nuclear imaging challenging, though outpt PET/stress is an option.  Avoid iodinated  contrast in the setting of hyperthyroidism.  Cont ? blocker.  Statin naive w/ LDL of only 64.  5.  OSA:  On CPAP.  6.  ETOH Abuse:  CIWA protocol.  7.  Hypokalemia:  K+ nl this AM.  Signed, 2907, NP  06/27/2022, 9:45 AM    For questions or updates, please contact   Please consult [email or web address] for contact info under Cardiology/STEMI.

## 2022-06-27 NOTE — Progress Notes (Signed)
ANTICOAGULATION CONSULT NOTE  Pharmacy Consult for IV heparin Indication: chest pain/ACS  No Known Allergies  Patient Measurements: Height: 5\' 6"  (167.6 cm) Weight: 117.1 kg (258 lb 2.5 oz) IBW/kg (Calculated) : 63.8 Heparin Dosing Weight: 96 kg  Vital Signs: Temp: 98.2 F (36.8 C) (10/12 0400) Temp Source: Oral (10/12 0400) BP: 122/89 (10/12 0700) Pulse Rate: 74 (10/12 0700)  Labs: Recent Labs    06/24/22 1150 06/24/22 1414 06/24/22 1548 06/24/22 2055 06/25/22 0516 06/25/22 1223 06/26/22 0603 06/27/22 0619  HGB 12.7*  --   --   --  12.6*  --  12.7*  --   HCT 39.8  --   --   --  39.1  --  39.5  --   PLT 324  --   --   --  341  --  314  --   APTT  --   --  28  --   --   --   --   --   LABPROT  --   --  15.2  --   --   --   --   --   INR  --   --  1.2  --   --   --   --   --   HEPARINUNFRC  --   --   --    < > 0.66 0.54 0.54 0.49  CREATININE 0.94  0.92  --   --   --  1.18  --  1.14  --   TROPONINIHS 2,906* 2,727*  --   --   --   --   --   --    < > = values in this interval not displayed.     Estimated Creatinine Clearance: 107.8 mL/min (by C-G formula based on SCr of 1.14 mg/dL).   Medical History: Past Medical History:  Diagnosis Date   Asthma    Hypertension     Medications:  Heparin Dosing Weight: 96 kg No PTA anticoagulation per my chart review  Assessment: 36 year old male with h/o HTN, uncontrolled T2DM, and OSA presenting with new onset CHF and c/f NSTEMI. Pharmacy consulted for initiation/mgmt of heparin gtt.  Date Time aPTT/HL Rate/Comment 10/10 0516 0.66  Thera x1 10/10 1223 0.54  Thera x2 10/11 0603 0.54  Thera x3; 1450 un/hr   10/12   0619   0.49                 Thera X 4 @ 1450 units/hr    Baseline Labs: aPTT - 28s; INR - 1.2 Hgb - 12.7; Plts - 314  Goal of Therapy:  Heparin level 0.3-0.7 units/ml Monitor platelets by anticoagulation protocol: Yes   Plan:  10/12:  HL @ 0619 = 0.49, therapeutic X 4  Will continue pt on current  rate and recheck HL on 10/13 with AM labs.   Jazzlene Huot D 06/27/2022,7:07 AM

## 2022-06-27 NOTE — TOC Benefit Eligibility Note (Signed)
Patient Advocate Encounter  Insurance verification completed.    The patient is currently admitted and upon discharge could be taking Eliquis 5 mg.  Requires Prior Authorization  The patient is insured through [employer]    Sidni Fusco, CPhT Pharmacy Patient Advocate Specialist  Pharmacy Patient Advocate Team Direct Number: [phone]  Fax: [phone]        

## 2022-06-27 NOTE — Progress Notes (Signed)
PROGRESS NOTE    Arthur Barnes  JKK:[id number] DOB: Nov 09, 1985 DOA: 06/24/2022 PCP: [facility]    Brief Narrative:  Arthur Barnes is a 36 y.o. male with medical history significant of HTN, uncontrolled T2DM and OSA who presents to the ED with c/o leg swelling and SOB x1 week, chest pain x 1 day brief episode.  10/09: On arrival to the ED, patient was hypertensive at 175/135 with heart rate of 155.  He was saturating at 95% on room air prior to being placed on 2 L nasal cannula of supplemental oxygen for work of breathing.  EKG sinus tachycardia up to 155.  CXR cardiomegaly with left pleural effusion, interstitial edema.  Given hypervolemia on examination, he was given one-time dose of Lasix.  S3 gallop, +2 edema to knees on exam.  Admitted for NSTEMI, acute heart failure, due to thyroid storm with undetectable TSH and elevated free T4 at 1.34.  Cardiology and PCCM consulted. 10/10: Changed methimazole to PTU. Significant tachycardia but unlikely to correct until underlying hyperthyroidism improves. Independently, Dr Karna Christmas spoke w/ Dr Tedd Sias [facility], I secure chatted w/ Dr Lucianne Muss of [facility] - both specialists agree unlikely true thyroid storm, cardiac issues seem to be primary concern, unlikely patient will improve until several weeks on medicine. Remains on heparin for NSTEMI and if cardiology clears for discharge he can go on methimazole or PTU, would stop steroids and iodide, would NOT increase calcium channel blocker or beta blocker as this may have minimal effect on HR and will be likely to cause hypotension     10/11 dc ativan. 10/12 no withdrawal.     Consultants:  Cardiology PCCM  Procedures:   Antimicrobials:      Subjective: Wake, feeling better, less sob, no cp    Objective: Vitals:   06/27/22 1500 06/27/22 1600 06/27/22 1621 06/27/22 1700  BP: (!) 123/92 106/74  133/81  Pulse: 92 (!) 104 85 73  Resp: 18 19 (!) 24 (!) 21  Temp:   98.2 F  (36.8 C)   TempSrc:   Oral   SpO2: 97% 98% 97% 96%  Weight:      Height:        Intake/Output Summary (Last 24 hours) at 06/27/2022 1834 Last data filed at 06/27/2022 1645 Gross per 24 hour  Intake 2006.3 ml  Output 4300 ml  Net -2293.7 ml   Filed Weights   06/25/22 0500 06/26/22 0500 06/27/22 0438  Weight: 118.6 kg 119 kg 117.1 kg    Examination: Calm, NAD Cta no w/r Reg s1/s2 no gallop Soft benign +bs No edema Aaoxox3  Mood and affect appropriate in current setting    Data Reviewed: I have personally reviewed following labs and imaging studies  CBC: Recent Labs  Lab 06/24/22 1150 06/25/22 0516 06/26/22 0603  WBC 8.3 9.8 8.6  HGB 12.7* 12.6* 12.7*  HCT 39.8 39.1 39.5  MCV 90.9 90.5 91.4  PLT 324 341 314   Basic Metabolic Panel: Recent Labs  Lab 06/24/22 1150 06/24/22 1506 06/25/22 0516 06/26/22 0603 06/27/22 0722  NA 137  139  --  134* 137 137  K 3.8  3.8  --  4.5 3.4* 3.8  CL 102  102  --  101 103 106  CO2 26  25  --  25 25 26   GLUCOSE 96  100*  --  148* 130* 99  BUN 7  7  --  19 18 13   CREATININE 0.94  0.92  --  1.18 1.14 1.01  CALCIUM 8.7*  9.0  --  9.1 8.6* 8.6*  MG  --  2.0 2.0 2.1 2.2  PHOS  --   --  4.4 4.6 3.7   GFR: Estimated Creatinine Clearance: 121.7 mL/min (by C-G formula based on SCr of 1.01 mg/dL). Liver Function Tests: Recent Labs  Lab 06/24/22 1150 06/25/22 0516  AST 29 24  ALT 35 31  ALKPHOS 67 65  BILITOT 0.8 1.0  PROT 7.3 7.8  ALBUMIN 3.4* 3.4*   No results for input(s): "LIPASE", "AMYLASE" in the last 168 hours. No results for input(s): "AMMONIA" in the last 168 hours. Coagulation Profile: Recent Labs  Lab 06/24/22 1548  INR 1.2   Cardiac Enzymes: No results for input(s): "CKTOTAL", "CKMB", "CKMBINDEX", "TROPONINI" in the last 168 hours. BNP (last 3 results) No results for input(s): "PROBNP" in the last 8760 hours. HbA1C: No results for input(s): "HGBA1C" in the last 72 hours.  CBG: Recent Labs   Lab 06/26/22 1609 06/26/22 2115 06/27/22 0744 06/27/22 1114 06/27/22 1620  GLUCAP 122* 110* 95 153* 168*   Lipid Profile: No results for input(s): "CHOL", "HDL", "LDLCALC", "TRIG", "CHOLHDL", "LDLDIRECT" in the last 72 hours.  Thyroid Function Tests: Recent Labs    06/26/22 0603  FREET4 1.52*   Anemia Panel: No results for input(s): "VITAMINB12", "FOLATE", "FERRITIN", "TIBC", "IRON", "RETICCTPCT" in the last 72 hours. Sepsis Labs: Recent Labs  Lab 06/24/22 1950 06/25/22 0516 06/26/22 0603  PROCALCITON <0.10 <0.10 <0.10  LATICACIDVEN 1.2  --   --     Recent Results (from the past 240 hour(s))  Resp Panel by RT-PCR (Flu A&B, Covid) Anterior Nasal Swab     Status: None   Collection Time: 06/24/22 12:05 PM   Specimen: Anterior Nasal Swab  Result Value Ref Range Status   SARS Coronavirus 2 by RT PCR NEGATIVE NEGATIVE Final    Comment: (NOTE) SARS-CoV-2 target nucleic acids are NOT DETECTED.  The SARS-CoV-2 RNA is generally detectable in upper respiratory specimens during the acute phase of infection. The lowest concentration of SARS-CoV-2 viral copies this assay can detect is 138 copies/mL. A negative result does not preclude SARS-Cov-2 infection and should not be used as the sole basis for treatment or other patient management decisions. A negative result may occur with  improper specimen collection/handling, submission of specimen other than nasopharyngeal swab, presence of viral mutation(s) within the areas targeted by this assay, and inadequate number of viral copies(<138 copies/mL). A negative result must be combined with clinical observations, patient history, and epidemiological information. The expected result is Negative.  Fact Sheet for Patients:  EntrepreneurPulse.com.au  Fact Sheet for Healthcare Providers:  IncredibleEmployment.be  This test is no t yet approved or cleared by the [place] FDA and  has been  authorized for detection and/or diagnosis of SARS-CoV-2 by FDA under an Emergency Use Authorization (EUA). This EUA will remain  in effect (meaning this test can be used) for the duration of the COVID-19 declaration under Section 564(b)(1) of the Act, 21 U.S.C.section 360bbb-3(b)(1), unless the authorization is terminated  or revoked sooner.       Influenza A by PCR NEGATIVE NEGATIVE Final   Influenza B by PCR NEGATIVE NEGATIVE Final    Comment: (NOTE) The Xpert Xpress SARS-CoV-2/FLU/RSV plus assay is intended as an aid in the diagnosis of influenza from Nasopharyngeal swab specimens and should not be used as a sole basis for treatment. Nasal washings and aspirates are unacceptable for Xpert Xpress SARS-CoV-2/FLU/RSV testing.  Fact Sheet for Patients: BloggerCourse.com  Fact Sheet for Healthcare Providers: SeriousBroker.it  This test is not yet approved or cleared by the [place] FDA and has been authorized for detection and/or diagnosis of SARS-CoV-2 by FDA under an Emergency Use Authorization (EUA). This EUA will remain in effect (meaning this test can be used) for the duration of the COVID-19 declaration under Section 564(b)(1) of the Act, 21 U.S.C. section 360bbb-3(b)(1), unless the authorization is terminated or revoked.  Performed at [facility], [address] [place], [place] [address]   Culture, blood (Routine X 2) w Reflex to ID Panel     Status: None (Preliminary result)   Collection Time: 06/24/22  7:37 PM   Specimen: BLOOD  Result Value Ref Range Status   Specimen Description BLOOD BLOOD LEFT HAND  Final   Special Requests   Final    BOTTLES DRAWN AEROBIC AND ANAEROBIC Blood Culture adequate volume   Culture   Final    NO GROWTH 3 DAYS Performed at [facility], [address] [place], [place] [address]    Report Status PENDING  Incomplete  Culture, blood (Routine X 2) w Reflex to  ID Panel     Status: None (Preliminary result)   Collection Time: 06/24/22  7:42 PM   Specimen: BLOOD  Result Value Ref Range Status   Specimen Description BLOOD BLOOD RIGHT HAND  Final   Special Requests   Final    BOTTLES DRAWN AEROBIC AND ANAEROBIC Blood Culture results may not be optimal due to an inadequate volume of blood received in culture bottles   Culture   Final    NO GROWTH 3 DAYS Performed at [facility], [address] [place], [place] [address]    Report Status PENDING  Incomplete  MRSA Next Gen by PCR, Nasal     Status: None   Collection Time: 06/25/22  6:00 AM   Specimen: Nasal Mucosa; Nasal Swab  Result Value Ref Range Status   MRSA by PCR Next Gen NOT DETECTED NOT DETECTED Final    Comment: (NOTE) The GeneXpert MRSA Assay (FDA approved for NASAL specimens only), is one component of a comprehensive MRSA colonization surveillance program. It is not intended to diagnose MRSA infection nor to guide or monitor treatment for MRSA infections. Test performance is not FDA approved in patients less than 66 years old. Performed at [facility], [address], [place], [place] [address]          Radiology Studies: No results found.      Scheduled Meds:  apixaban  5 mg Oral BID   Chlorhexidine Gluconate Cloth  6 each Topical Q0600   cholestyramine light  4 g Oral TID   folic acid  1 mg Oral Daily   furosemide  40 mg Intravenous Q12H   insulin aspart  0-20 Units Subcutaneous TID WC   metoprolol succinate  100 mg Oral BID   multivitamin with minerals  1 tablet Oral Daily   propylthiouracil  50 mg Oral Q8H   sodium chloride flush  3 mL Intravenous Q12H   thiamine  100 mg Oral Daily   Or   thiamine  100 mg Intravenous Daily   Continuous Infusions:    Assessment & Plan:   Principal Problem:   NSTEMI (non-ST elevated myocardial infarction) (HCC) Active Problems:   Thyroid storm   Acute heart failure (HCC)   Narrow complex  tachycardia   Type 2 diabetes mellitus (HCC)   Hypertension  OSA (obstructive sleep apnea)   Asthma   Alcohol use   Atrial flutter (HCC)   Cardiomyopathy (HCC)   Hyperthyroidism   Hypokalemia   Acute pulmonary edema (HCC)   Acute respiratory failure with hypoxia (HCC)   Hyperthyroidism uncertain etiology - ruled out Thyroid storm Patient with severe sinus tachycardia, acute heart failure and NSTEMI.   Undetectable TSH at less than 0.01 and elevated free T4 at 1.34 but minimal elevation makes thyroid storm unlikely.   Admit to stepdown unit pending improvement in HR PCCM and cardiology following; appreciate their recommendations Start methimazole 20 mg every 4 hours --> changed to PTU per UpToDate recs  Start hydrocortisone 100 mg every 8 hours --> d/c per endocrine recs Started SSKI iodine per UpToDate recs --> d/c per endocrine recs  Added thyroid Ab, eval for Graves Thyroid US, eval for nodular goiter / possible adenoma  10/11 avoid salicylate, nsaids can increase free thyroid hormone levels 10/12 f/u with endocrine as outpt.     Acute heart failure (HCC) Complicated by hyperthyroid, Hx HTN, DM2, OSA Cardiology consulted; appreciate their recommendations Monitor magnesium potassium daily while on diuretics Formal echocardiogram pending improvement in heart rate Initiate guideline directed medical therapy prior to discharge Strict in and outs Daily weights 10/12 continue iv lasix   Hypokalemia From iv lasix Will replace and monitor   NSTEMI (non-ST elevated myocardial infarction) (HCC) Complicated by hyperthyroid, Hx HTN, DM2, OSA Troponins have peaked at 2900 and are downtrending at this time.   LDL within goal at this time Heparin drip initiated by cardiology  will likely need heart catheterization prior to discharge. Aspirin 81 mg daily Formal echocardiogram pending improvement in tachycardia   Narrow complex tachycardia Aflutter with rvr Patient presenting  with narrow complex tachycardia with right bundle branch block with rates persistently in the 150s.   6 mg of adenosine and then 12 mg of adenosine without any improvement in tachycardia.   Diltiazem infusion started and titrated to maximum dose without improvement.   Given results demonstrating thyroid storm, was transitioned to beta-blocker. Metoprolol 5 mg IV injection every 4 hours as needed Metoprolol p.o. 25 mg every 6 hours - endocrine recommends would not increase  10/12 not good candidate for amiodarone due to thyroid dz Wean off diltiazem gtt in setting of CM and increase beta blk. Toprol xl started. Start  Noac   Type 2 diabetes mellitus (HCC) SSI, resistant scale 10/12 A1c 6.5   Hypertension Treating cardiac issues as above including antihypertensives Will optimize regimen prior to discharge    OSA (obstructive sleep apnea) CPAP at night while admitted Encouraged improved compliance at home postdischarge   Asthma Albuterol as needed for wheezing   Alcohol use Patient states he is drinking at least 3-4 alcohol beverages per day 5 days out of the week and then 7-9 beverages per day on the other 2 days.  Although patient is certainly overusing alcohol, no evidence of abuse at this time. Start daily folic acid and thiamine       DVT prophylaxis: scd Code Status:full Family Communication: none at bedside Disposition Plan:  Status is: Inpatient Remains inpatient appropriate because: iv treatemt        LOS: 3 days   Time spent: 35 min    Lynn Ito, MD [facility] Pager [phone]  If 7PM-7AM, please contact night-coverage 06/27/2022, 6:34 PM

## 2022-06-27 NOTE — Progress Notes (Signed)
[place] for Electrolyte Monitoring and Replacement   Recent Labs: Potassium (mmol/L)  Date Value  06/27/2022 3.8   Magnesium (mg/dL)  Date Value  06/27/2022 2.2   Calcium (mg/dL)  Date Value  06/27/2022 8.6 (L)   Albumin (g/dL)  Date Value  06/25/2022 3.4 (L)   Phosphorus (mg/dL)  Date Value  06/27/2022 3.7   Sodium (mmol/L)  Date Value  06/27/2022 137    Assessment: 36 year old man admitted with acute biventricular heart failure in the setting of atrial flutter and hyperthyroidism. Pharmacy is asked to review and replace electrolytes  Goal of Therapy:  Potassium 4.0 - 5.1 mmol/L Magnesium 2.0 - 2.4 mg/dL All Other Electrolytes WNL  Plan:  40 mEq po KCl x 1 Recheck electrolytes in am  Dallie Piles ,PharmD Clinical Pharmacist 06/27/2022 1:36 PM

## 2022-06-28 DIAGNOSIS — J9601 Acute respiratory failure with hypoxia: Secondary | ICD-10-CM | POA: Diagnosis not present

## 2022-06-28 DIAGNOSIS — I214 Non-ST elevation (NSTEMI) myocardial infarction: Secondary | ICD-10-CM | POA: Diagnosis not present

## 2022-06-28 DIAGNOSIS — Z789 Other specified health status: Secondary | ICD-10-CM | POA: Diagnosis not present

## 2022-06-28 DIAGNOSIS — I483 Typical atrial flutter: Secondary | ICD-10-CM | POA: Diagnosis not present

## 2022-06-28 DIAGNOSIS — I4719 Other supraventricular tachycardia: Secondary | ICD-10-CM

## 2022-06-28 DIAGNOSIS — J810 Acute pulmonary edema: Secondary | ICD-10-CM | POA: Diagnosis not present

## 2022-06-28 LAB — GLUCOSE, CAPILLARY
Glucose-Capillary: 102 mg/dL — ABNORMAL HIGH (ref 70–99)
Glucose-Capillary: 103 mg/dL — ABNORMAL HIGH (ref 70–99)
Glucose-Capillary: 79 mg/dL (ref 70–99)
Glucose-Capillary: 113 mg/dL — ABNORMAL HIGH (ref 70–99)

## 2022-06-28 LAB — T3: T3, Total: 144 ng/dL (ref 71–180)

## 2022-06-28 LAB — BASIC METABOLIC PANEL
Anion gap: 7 (ref 5–15)
BUN: 12 mg/dL (ref 6–20)
CO2: 28 mmol/L (ref 22–32)
Calcium: 9 mg/dL (ref 8.9–10.3)
Chloride: 104 mmol/L (ref 98–111)
Creatinine, Ser: 1.08 mg/dL (ref 0.61–1.24)
GFR, Estimated: >60 mL/min (ref >60)
Glucose, Bld: 119 mg/dL — ABNORMAL HIGH (ref 70–99)
Potassium: 3.5 mmol/L (ref 3.5–5.1)
Sodium: 139 mmol/L (ref 135–145)

## 2022-06-28 LAB — HEPARIN LEVEL (UNFRACTIONATED): Heparin Unfractionated: >1.1 IU/mL — ABNORMAL HIGH (ref 0.30–0.70)

## 2022-06-28 LAB — MAGNESIUM: Magnesium: 2 mg/dL (ref 1.7–2.4)

## 2022-06-28 LAB — PHOSPHORUS: Phosphorus: 3.6 mg/dL (ref 2.5–4.6)

## 2022-06-28 MED ORDER — MAGNESIUM SULFATE 2 GM/50ML IV SOLN
2.0000 g | Freq: Once | INTRAVENOUS | Status: AC
  Administered 2022-06-28: 2 g via INTRAVENOUS
  Filled 2022-06-28: qty 50

## 2022-06-28 MED ORDER — METHIMAZOLE 10 MG PO TABS
10.0000 mg | ORAL_TABLET | Freq: Every day | ORAL | Status: DC
  Administered 2022-06-28 – 2022-06-30 (×3): 10 mg via ORAL
  Filled 2022-06-28 (×3): qty 1

## 2022-06-28 MED ORDER — DILTIAZEM HCL ER COATED BEADS 120 MG PO CP24
120.0000 mg | ORAL_CAPSULE | Freq: Every day | ORAL | Status: DC
  Administered 2022-06-28 – 2022-06-30 (×3): 120 mg via ORAL
  Filled 2022-06-28 (×3): qty 1

## 2022-06-28 MED ORDER — POTASSIUM CHLORIDE CRYS ER 20 MEQ PO TBCR
40.0000 meq | EXTENDED_RELEASE_TABLET | ORAL | Status: AC
  Administered 2022-06-28 (×2): 40 meq via ORAL
  Filled 2022-06-28: qty 2

## 2022-06-28 NOTE — Progress Notes (Signed)
[place] for Electrolyte Monitoring and Replacement   Recent Labs: Potassium (mmol/L)  Date Value  06/28/2022 3.5   Magnesium (mg/dL)  Date Value  06/28/2022 2.0   Calcium (mg/dL)  Date Value  06/28/2022 9.0   Albumin (g/dL)  Date Value  06/25/2022 3.4 (L)   Phosphorus (mg/dL)  Date Value  06/28/2022 3.6   Sodium (mmol/L)  Date Value  06/28/2022 139    Assessment: 36 year old man admitted with acute biventricular heart failure in the setting of atrial flutter and hyperthyroidism. Pharmacy is asked to review and replace electrolytes  Medications: Lasix 40mg  IV q12h  Goal of Therapy:  Potassium 4.0 - 5.1 mmol/L Magnesium 2.0 - 2.4 mg/dL All Other Electrolytes WNL  Plan:  Scr stable 1-1.1 with good UOP 1.1>2 ml/k/h [Net -6.8L] K 3.8>3.5: after 40 mEq po KCl x1 on 10/12 Will replete with 28meq PO q2h x2 doses. Mg 2.2>2:  Will give MgSO4 2g IV x1 based on trend and UOP.  Recheck electrolytes in AM  Lorna Dibble ,PharmD Clinical Pharmacist 06/28/2022 8:44 AM

## 2022-06-28 NOTE — Progress Notes (Signed)
Rounding Note    Patient Name: Arthur Barnes Date of Encounter: 06/28/2022  [place] Cardiologist: Kathlyn Sacramento, MD   Subjective   Resting comfortably this morning, denies shortness of breath Does not feel abdomen is distended, denies leg swelling 4 L negative Telemetry reviewed, heart rate 100 up to 120 atrial fibrillation/flutter  Inpatient Medications    Scheduled Meds:  apixaban  5 mg Oral BID   Chlorhexidine Gluconate Cloth  6 each Topical Q0600   cholestyramine light  4 g Oral TID   diltiazem  120 mg Oral Daily   folic acid  1 mg Oral Daily   furosemide  40 mg Intravenous Q12H   insulin aspart  0-20 Units Subcutaneous TID WC   metoprolol succinate  100 mg Oral BID   multivitamin with minerals  1 tablet Oral Daily   potassium chloride  40 mEq Oral Q2H   propylthiouracil  50 mg Oral Q8H   sodium chloride flush  3 mL Intravenous Q12H   thiamine  100 mg Oral Daily   Or   thiamine  100 mg Intravenous Daily   Continuous Infusions:  magnesium sulfate bolus IVPB     PRN Meds: acetaminophen **OR** acetaminophen, albuterol, metoprolol tartrate, mouth rinse, polyethylene glycol   Vital Signs    Vitals:   06/28/22 0400 06/28/22 0500 06/28/22 0600 06/28/22 0700  BP: (!) 122/106  (!) 134/95 (!) 147/105  Pulse: (!) 105  85 96  Resp: (!) 21  (!) 24 14  Temp: 99 F (37.2 C)     TempSrc: Oral     SpO2: 96%  95% 93%  Weight:  115 kg    Height:        Intake/Output Summary (Last 24 hours) at 06/28/2022 1019 Last data filed at 06/28/2022 0500 Gross per 24 hour  Intake 969.72 ml  Output 4800 ml  Net -3830.28 ml      06/28/2022    5:00 AM 06/27/2022    4:38 AM 06/26/2022    5:00 AM  Last 3 Weights  Weight (lbs) 253 lb 8.5 oz 258 lb 2.5 oz 262 lb 5.6 oz  Weight (kg) 115 kg 117.1 kg 119 kg      Telemetry    Atrial fibrillation/flutter rate 100 up to 110- Personally Reviewed  ECG     - Personally Reviewed  Physical Exam   GEN: No  acute distress.   Neck: No JVD Cardiac: RRR, no murmurs, rubs, or gallops.  Respiratory: Clear to auscultation bilaterally. GI: Soft, nontender, non-distended  MS: No edema; No deformity. Neuro:  Nonfocal  Psych: Normal affect   Labs    High Sensitivity Troponin:   Recent Labs  Lab 06/24/22 1150 06/24/22 1414  TROPONINIHS 2,906* 2,727*     Chemistry Recent Labs  Lab 06/24/22 1150 06/24/22 1506 06/25/22 0516 06/26/22 0603 06/27/22 0722 06/28/22 0510  NA 137  139  --  134* 137 137 139  K 3.8  3.8  --  4.5 3.4* 3.8 3.5  CL 102  102  --  101 103 106 104  CO2 26  25  --  25 25 26 28   GLUCOSE 96  100*  --  148* 130* 99 119*  BUN 7  7  --  19 18 13 12   CREATININE 0.94  0.92  --  1.18 1.14 1.01 1.08  CALCIUM 8.7*  9.0  --  9.1 8.6* 8.6* 9.0  MG  --    < > 2.0 2.1  2.2 2.0  PROT 7.3  --  7.8  --   --   --   ALBUMIN 3.4*  --  3.4*  --   --   --   AST 29  --  24  --   --   --   ALT 35  --  31  --   --   --   ALKPHOS 67  --  65  --   --   --   BILITOT 0.8  --  1.0  --   --   --   GFRNONAA >60  >60  --  >60 >60 >60 >60  ANIONGAP 9  12  --  8 9 5 7    < > = values in this interval not displayed.    Lipids  Recent Labs  Lab 06/24/22 1506  CHOL 113  TRIG 71  HDL 35*  LDLCALC 64  CHOLHDL 3.2    Hematology Recent Labs  Lab 06/24/22 1150 06/25/22 0516 06/26/22 0603  WBC 8.3 9.8 8.6  RBC 4.38 4.32 4.32  HGB 12.7* 12.6* 12.7*  HCT 39.8 39.1 39.5  MCV 90.9 90.5 91.4  MCH 29.0 29.2 29.4  MCHC 31.9 32.2 32.2  RDW 14.4 14.2 14.0  PLT 324 341 314   Thyroid  Recent Labs  Lab 06/24/22 1506 06/24/22 1612 06/26/22 0603  TSH <0.010*  --   --   FREET4  --    < > 1.52*   < > = values in this interval not displayed.    BNP Recent Labs  Lab 06/24/22 1150  BNP 91.0    DDimer No results for input(s): "DDIMER" in the last 168 hours.   Radiology    No results found.  Cardiac Studies   Echocardiogram  1. Left ventricular ejection fraction, by  estimation, is 40 to 45%. The  left ventricle has moderately decreased function. The left ventricle  demonstrates global hypokinesis. The left ventricular internal cavity size  was mildly dilated. There is moderate   left ventricular hypertrophy. Left ventricular diastolic parameters are  indeterminate.   2. Right ventricular systolic function is mildly reduced. The right  ventricular size is mildly enlarged. There is normal pulmonary artery  systolic pressure.   3. Right atrial size was mildly dilated.   4. The mitral valve is normal in structure. Mild mitral valve  regurgitation.   5. The aortic valve was not well visualized. Aortic valve regurgitation  is not visualized. No aortic stenosis is present.   6. The inferior vena cava is dilated in size with <50% respiratory  variability, suggesting right atrial pressure of 15 mmHg.   Patient Profile     36 y.o. male with a history of HTN, asthma, obesity, and OSA on CPAP, who was admitted 10/9 w/ CHF, Afib w/ RVR, and hyperthyroidism.  HsTrop 2906.  Assessment & Plan    Atrial flutter with RVR Presenting to the emergency room heart rate 150 bpm did not respond to adenosine noted to have atrial flutter with 2-1 conduction, Did not respond well to diltiazem infusion, noted to have severe hyperthyroidism with undetectable TSH --started on heparin infusion, metoprolol, diltiazem infusion -Yesterday was weaned off diltiazem infusion and started on metoprolol succinate 100 twice daily -Not a good candidate for amiodarone given thyroid disease Rate continues to run fast 100 up to 120 overnight Recommend we add diltiazem ER 120 for rate control Continue Eliquis If rate continues to run high may need to add digoxin  Hypothyroidism Currently on beta-blocker, PTU   Alcohol abuse Cessation recommended   Acute diastolic and systolic CHF Ejection fraction 40 to 45%, Likely in the setting of atrial flutter with RVR, alcohol abuse Alcohol  cessation recommended Would continue IV Lasix twice daily  Renal function stable At the time of discharge would start Lasix po 40 daily   Total encounter time more than 50 minutes  Greater than 50% was spent in counseling and coordination of care with the patient    For questions or updates, please contact [place] Please consult [email or web address] for contact info under        Signed, Ida Rogue, MD  06/28/2022, 10:19 AM

## 2022-06-28 NOTE — Progress Notes (Signed)
PROGRESS NOTE    Arthur Barnes  EPP:[id number] DOB: 20-Sep-1985 DOA: 06/24/2022 PCP: [facility]    Brief Narrative:  Arthur Barnes is a 36 y.o. male with medical history significant of HTN, uncontrolled T2DM and OSA who presents to the ED with c/o leg swelling and SOB x1 week, chest pain x 1 day brief episode.  10/09: On arrival to the ED, patient was hypertensive at 175/135 with heart rate of 155.  He was saturating at 95% on room air prior to being placed on 2 L nasal cannula of supplemental oxygen for work of breathing.  EKG sinus tachycardia up to 155.  CXR cardiomegaly with left pleural effusion, interstitial edema.  Given hypervolemia on examination, he was given one-time dose of Lasix.  S3 gallop, +2 edema to knees on exam.  Admitted for NSTEMI, acute heart failure, due to thyroid storm with undetectable TSH and elevated free T4 at 1.34.  Cardiology and PCCM consulted. 10/10: Changed methimazole to PTU. Significant tachycardia but unlikely to correct until underlying hyperthyroidism improves. Independently, Dr Lanney Gins spoke w/ Dr Gabriel Carina [facility], I secure chatted w/ Dr Dwyane Dee of [facility] - both specialists agree unlikely true thyroid storm, cardiac issues seem to be primary concern, unlikely patient will improve until several weeks on medicine. Remains on heparin for NSTEMI and if cardiology clears for discharge he can go on methimazole or PTU, would stop steroids and iodide, would NOT increase calcium channel blocker or beta blocker as this may have minimal effect on HR and will be likely to cause hypotension     10/11 dc ativan. 10/12 no withdrawal. 10/13 eating pretzel today. HR at rest is 120's. Reports no dizziness or cp. Less sob.      Consultants:  Cardiology PCCM  Procedures:   Antimicrobials:      Subjective: As above    Objective: Vitals:   06/28/22 1200 06/28/22 1300 06/28/22 1400 06/28/22 1500  BP: 107/74     Pulse: (!) 119 (!) 122 78 66   Resp: 14 19 20 15   Temp: 98.5 F (36.9 C)     TempSrc: Oral     SpO2: 97% 95% 96% 98%  Weight:      Height:        Intake/Output Summary (Last 24 hours) at 06/28/2022 1624 Last data filed at 06/28/2022 1330 Gross per 24 hour  Intake 410 ml  Output 4850 ml  Net -4440 ml   Filed Weights   06/26/22 0500 06/27/22 0438 06/28/22 0500  Weight: 119 kg 117.1 kg 115 kg    Examination: Calm, NAD Decrease bs b/l Tachy, irreg s1/s2 no gallop Soft benign +bs +LE edema, improving Aaoxox3  Mood and affect appropriate in current setting     Data Reviewed: I have personally reviewed following labs and imaging studies  CBC: Recent Labs  Lab 06/24/22 1150 06/25/22 0516 06/26/22 0603  WBC 8.3 9.8 8.6  HGB 12.7* 12.6* 12.7*  HCT 39.8 39.1 39.5  MCV 90.9 90.5 91.4  PLT 324 341 606   Basic Metabolic Panel: Recent Labs  Lab 06/24/22 1150 06/24/22 1506 06/25/22 0516 06/26/22 0603 06/27/22 0722 06/28/22 0510  NA 137  139  --  134* 137 137 139  K 3.8  3.8  --  4.5 3.4* 3.8 3.5  CL 102  102  --  101 103 106 104  CO2 26  25  --  25 25 26 28   GLUCOSE 96  100*  --  148* 130* 99  119*  BUN 7  7  --  19 18 13 12   CREATININE 0.94  0.92  --  1.18 1.14 1.01 1.08  CALCIUM 8.7*  9.0  --  9.1 8.6* 8.6* 9.0  MG  --  2.0 2.0 2.1 2.2 2.0  PHOS  --   --  4.4 4.6 3.7 3.6   GFR: Estimated Creatinine Clearance: 112.7 mL/min (by C-G formula based on SCr of 1.08 mg/dL). Liver Function Tests: Recent Labs  Lab 06/24/22 1150 06/25/22 0516  AST 29 24  ALT 35 31  ALKPHOS 67 65  BILITOT 0.8 1.0  PROT 7.3 7.8  ALBUMIN 3.4* 3.4*   No results for input(s): "LIPASE", "AMYLASE" in the last 168 hours. No results for input(s): "AMMONIA" in the last 168 hours. Coagulation Profile: Recent Labs  Lab 06/24/22 1548  INR 1.2   Cardiac Enzymes: No results for input(s): "CKTOTAL", "CKMB", "CKMBINDEX", "TROPONINI" in the last 168 hours. BNP (last 3 results) No results for input(s):  "PROBNP" in the last 8760 hours. HbA1C: No results for input(s): "HGBA1C" in the last 72 hours.  CBG: Recent Labs  Lab 06/27/22 1114 06/27/22 1620 06/27/22 2151 06/28/22 0740 06/28/22 1200  GLUCAP 153* 168* 87 102* 103*   Lipid Profile: No results for input(s): "CHOL", "HDL", "LDLCALC", "TRIG", "CHOLHDL", "LDLDIRECT" in the last 72 hours.  Thyroid Function Tests: Recent Labs    06/26/22 0603  FREET4 1.52*   Anemia Panel: No results for input(s): "VITAMINB12", "FOLATE", "FERRITIN", "TIBC", "IRON", "RETICCTPCT" in the last 72 hours. Sepsis Labs: Recent Labs  Lab 06/24/22 1950 06/25/22 0516 06/26/22 0603  PROCALCITON <0.10 <0.10 <0.10  LATICACIDVEN 1.2  --   --     Recent Results (from the past 240 hour(s))  Resp Panel by RT-PCR (Flu A&B, Covid) Anterior Nasal Swab     Status: None   Collection Time: 06/24/22 12:05 PM   Specimen: Anterior Nasal Swab  Result Value Ref Range Status   SARS Coronavirus 2 by RT PCR NEGATIVE NEGATIVE Final    Comment: (NOTE) SARS-CoV-2 target nucleic acids are NOT DETECTED.  The SARS-CoV-2 RNA is generally detectable in upper respiratory specimens during the acute phase of infection. The lowest concentration of SARS-CoV-2 viral copies this assay can detect is 138 copies/mL. A negative result does not preclude SARS-Cov-2 infection and should not be used as the sole basis for treatment or other patient management decisions. A negative result may occur with  improper specimen collection/handling, submission of specimen other than nasopharyngeal swab, presence of viral mutation(s) within the areas targeted by this assay, and inadequate number of viral copies(<138 copies/mL). A negative result must be combined with clinical observations, patient history, and epidemiological information. The expected result is Negative.  Fact Sheet for Patients:  EntrepreneurPulse.com.au  Fact Sheet for Healthcare Providers:   IncredibleEmployment.be  This test is no t yet approved or cleared by the [place] FDA and  has been authorized for detection and/or diagnosis of SARS-CoV-2 by FDA under an Emergency Use Authorization (EUA). This EUA will remain  in effect (meaning this test can be used) for the duration of the COVID-19 declaration under Section 564(b)(1) of the Act, 21 U.S.C.section 360bbb-3(b)(1), unless the authorization is terminated  or revoked sooner.       Influenza A by PCR NEGATIVE NEGATIVE Final   Influenza B by PCR NEGATIVE NEGATIVE Final    Comment: (NOTE) The Xpert Xpress SARS-CoV-2/FLU/RSV plus assay is intended as an aid in the diagnosis of influenza from Nasopharyngeal  swab specimens and should not be used as a sole basis for treatment. Nasal washings and aspirates are unacceptable for Xpert Xpress SARS-CoV-2/FLU/RSV testing.  Fact Sheet for Patients: BloggerCourse.com  Fact Sheet for Healthcare Providers: SeriousBroker.it  This test is not yet approved or cleared by the [place] FDA and has been authorized for detection and/or diagnosis of SARS-CoV-2 by FDA under an Emergency Use Authorization (EUA). This EUA will remain in effect (meaning this test can be used) for the duration of the COVID-19 declaration under Section 564(b)(1) of the Act, 21 U.S.C. section 360bbb-3(b)(1), unless the authorization is terminated or revoked.  Performed at [facility], [address] [place], [place] [address]   Culture, blood (Routine X 2) w Reflex to ID Panel     Status: None (Preliminary result)   Collection Time: 06/24/22  7:37 PM   Specimen: BLOOD  Result Value Ref Range Status   Specimen Description BLOOD BLOOD LEFT HAND  Final   Special Requests   Final    BOTTLES DRAWN AEROBIC AND ANAEROBIC Blood Culture adequate volume   Culture   Final    NO GROWTH 4 DAYS Performed at [facility], [address] [place], [place] [address]    Report Status PENDING  Incomplete  Culture, blood (Routine X 2) w Reflex to ID Panel     Status: None (Preliminary result)   Collection Time: 06/24/22  7:42 PM   Specimen: BLOOD  Result Value Ref Range Status   Specimen Description BLOOD BLOOD RIGHT HAND  Final   Special Requests   Final    BOTTLES DRAWN AEROBIC AND ANAEROBIC Blood Culture results may not be optimal due to an inadequate volume of blood received in culture bottles   Culture   Final    NO GROWTH 4 DAYS Performed at [facility], [address] [place], [place] [address]    Report Status PENDING  Incomplete  MRSA Next Gen by PCR, Nasal     Status: None   Collection Time: 06/25/22  6:00 AM   Specimen: Nasal Mucosa; Nasal Swab  Result Value Ref Range Status   MRSA by PCR Next Gen NOT DETECTED NOT DETECTED Final    Comment: (NOTE) The GeneXpert MRSA Assay (FDA approved for NASAL specimens only), is one component of a comprehensive MRSA colonization surveillance program. It is not intended to diagnose MRSA infection nor to guide or monitor treatment for MRSA infections. Test performance is not FDA approved in patients less than 55 years old. Performed at [facility], [address] [place], [place] [address]          Radiology Studies: No results found.      Scheduled Meds:  apixaban  5 mg Oral BID   Chlorhexidine Gluconate Cloth  6 each Topical Q0600   cholestyramine light  4 g Oral TID   diltiazem  120 mg Oral Daily   folic acid  1 mg Oral Daily   furosemide  40 mg Intravenous Q12H   insulin aspart  0-20 Units Subcutaneous TID WC   methimazole  10 mg Oral Daily   metoprolol succinate  100 mg Oral BID   multivitamin with minerals  1 tablet Oral Daily   sodium chloride flush  3 mL Intravenous Q12H   thiamine  100 mg Oral Daily   Or   thiamine  100 mg Intravenous Daily   Continuous Infusions:    Assessment & Plan:    Principal Problem:  NSTEMI (non-ST elevated myocardial infarction) [facility]) Active Problems:   Thyroid storm   Acute heart failure (HCC)   Narrow complex tachycardia   Type 2 diabetes mellitus (HCC)   Hypertension   OSA (obstructive sleep apnea)   Asthma   Alcohol use   Atrial flutter (HCC)   Cardiomyopathy ([place])   Hyperthyroidism   Hypokalemia   Acute pulmonary edema (HCC)   Acute respiratory failure with hypoxia (HCC)   Hyperthyroidism uncertain etiology - ruled out Thyroid storm Patient with severe sinus tachycardia, acute heart failure and NSTEMI.   Undetectable TSH at less than 0.01 and elevated free T4 at 1.34 but minimal elevation makes thyroid storm unlikely.   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  Likely in the setting of a flutter with RVR, alcohol abuse and thyroid issues Continue IV Lasix Needs heart rate control Likely will need repeat echo as outpatient once heart rate and thyroid issues have resolved  Hypokalemia Replace and monitor   NSTEMI (non-ST elevated myocardial infarction) ([place]) Complicated by hyperthyroid, Hx HTN, DM2, OSA Troponins have peaked at 2900 and are downtrending at this time.   LDL within goal at this time Heparin drip initiated by cardiology  will likely need heart catheterization prior to discharge. Aspirin 81  mg daily Formal echocardiogram pending improvement in tachycardia   Narrow complex tachycardia Aflutter with rvr Patient presenting with narrow complex tachycardia with right bundle branch block with rates persistently in the 150s.   6 mg of adenosine and then 12 mg of adenosine without any improvement in tachycardia.   Diltiazem infusion started and titrated to maximum dose without improvement.   Given results demonstrating thyroid storm, was transitioned to beta-blocker. Metoprolol 5 mg IV injection every 4 hours as needed Metoprolol p.o. 25 mg every 6 hours - endocrine recommends would not increase  10/12 not good candidate for amiodarone due to thyroid dz Wean off diltiazem gtt in setting of CM and increase beta blk. Toprol xl started. 10/13.  On Cardizem ER 120 for better rate control Continue NOAC    Type 2 diabetes mellitus (HCC) SSI, resistant scale 10/12 A1c 6.5   Hypertension Treating cardiac issues as above including antihypertensives Will optimize regimen prior to discharge    OSA (obstructive sleep apnea) CPAP at night while admitted Encouraged improved compliance at home postdischarge   Asthma Albuterol as needed for wheezing   Alcohol use Patient states he is drinking at least 3-4 alcohol beverages per day 5 days out of the week and then 7-9 beverages per day on the other 2 days.  Although patient is certainly overusing alcohol, no evidence of abuse at this time. Start daily folic acid and thiamine       DVT prophylaxis: Eliquis Code Status:full Family Communication: none at bedside Disposition Plan:  Status is: Inpatient Remains inpatient appropriate because: iv treatemt heart rate still not controlled  LOS: 4 days   Time spent: 35 min    Nolberto Hanlon, MD [facility] Pager [phone]  If 7PM-7AM, please contact night-coverage 06/28/2022, 4:24 PM

## 2022-06-28 NOTE — Plan of Care (Signed)
Continuing with plan of care. 

## 2022-06-29 DIAGNOSIS — I5041 Acute combined systolic (congestive) and diastolic (congestive) heart failure: Secondary | ICD-10-CM

## 2022-06-29 DIAGNOSIS — E0590 Thyrotoxicosis, unspecified without thyrotoxic crisis or storm: Secondary | ICD-10-CM | POA: Diagnosis not present

## 2022-06-29 DIAGNOSIS — I420 Dilated cardiomyopathy: Secondary | ICD-10-CM | POA: Diagnosis not present

## 2022-06-29 DIAGNOSIS — I214 Non-ST elevation (NSTEMI) myocardial infarction: Secondary | ICD-10-CM | POA: Diagnosis not present

## 2022-06-29 DIAGNOSIS — I483 Typical atrial flutter: Secondary | ICD-10-CM | POA: Diagnosis not present

## 2022-06-29 LAB — HEPATIC FUNCTION PANEL
ALT: 34 U/L (ref 0–44)
AST: 23 U/L (ref 15–41)
Albumin: 3.2 g/dL — ABNORMAL LOW (ref 3.5–5.0)
Alkaline Phosphatase: 62 U/L (ref 38–126)
Bilirubin, Direct: <0.1 mg/dL (ref 0.0–0.2)
Total Bilirubin: 0.7 mg/dL (ref 0.3–1.2)
Total Protein: 7.6 g/dL (ref 6.5–8.1)

## 2022-06-29 LAB — POTASSIUM: Potassium: 4.1 mmol/L (ref 3.5–5.1)

## 2022-06-29 LAB — PHOSPHORUS: Phosphorus: 4.7 mg/dL — ABNORMAL HIGH (ref 2.5–4.6)

## 2022-06-29 LAB — GLUCOSE, CAPILLARY
Glucose-Capillary: 94 mg/dL (ref 70–99)
Glucose-Capillary: 108 mg/dL — ABNORMAL HIGH (ref 70–99)
Glucose-Capillary: 121 mg/dL — ABNORMAL HIGH (ref 70–99)
Glucose-Capillary: 87 mg/dL (ref 70–99)

## 2022-06-29 LAB — CULTURE, BLOOD (ROUTINE X 2)
Culture: NO GROWTH
Culture: NO GROWTH
Special Requests: ADEQUATE

## 2022-06-29 LAB — CREATININE, SERUM
Creatinine, Ser: 1.14 mg/dL (ref 0.61–1.24)
GFR, Estimated: >60 mL/min (ref >60)

## 2022-06-29 LAB — MAGNESIUM: Magnesium: 2.3 mg/dL (ref 1.7–2.4)

## 2022-06-29 MED ORDER — FUROSEMIDE 20 MG PO TABS
40.0000 mg | ORAL_TABLET | Freq: Every day | ORAL | Status: DC
  Administered 2022-06-30: 40 mg via ORAL
  Filled 2022-06-29: qty 2

## 2022-06-29 NOTE — Plan of Care (Signed)
Continuing with plan of care. 

## 2022-06-29 NOTE — Progress Notes (Signed)
[place] for Electrolyte Monitoring and Replacement   Recent Labs: Potassium (mmol/L)  Date Value  06/29/2022 4.1   Magnesium (mg/dL)  Date Value  06/29/2022 2.3   Calcium (mg/dL)  Date Value  06/28/2022 9.0   Albumin (g/dL)  Date Value  06/25/2022 3.4 (L)   Phosphorus (mg/dL)  Date Value  06/29/2022 4.7 (H)   Sodium (mmol/L)  Date Value  06/28/2022 139    Assessment: 36 year old man admitted with acute biventricular heart failure in the setting of atrial flutter and hyperthyroidism. Pharmacy is asked to review and replace electrolytes  Medications: Lasix 40mg  IV q12h  Goal of Therapy:  Potassium 4.0 - 5.1 mmol/L Magnesium 2.0 - 2.4 mg/dL All Other Electrolytes WNL  Plan:  Scr stable 1-1.1 with good UOP 2>0.9 ml/k/h [Net -9.2L] K 3.5>4.1: after 22meq PO q2h x2 doses on 10/13  WNL, no further repletion today. Mg 2>2.3:  WNL, no further repletion today.  Recheck electrolytes in AM  Lorna Dibble ,PharmD Clinical Pharmacist 06/29/2022 7:28 AM

## 2022-06-29 NOTE — Progress Notes (Signed)
Rounding Note    Patient Name: Arthur Barnes Date of Encounter: 06/29/2022  [facility] Cardiologist: Lorine Bears, MD   Subjective   Feeling much better. Swelling and breathing much improved. Asking if he can get up and walk.  Inpatient Medications    Scheduled Meds:  apixaban  5 mg Oral BID   Chlorhexidine Gluconate Cloth  6 each Topical Q0600   cholestyramine light  4 g Oral TID   diltiazem  120 mg Oral Daily   folic acid  1 mg Oral Daily   furosemide  40 mg Oral Daily   insulin aspart  0-20 Units Subcutaneous TID WC   methimazole  10 mg Oral Daily   metoprolol succinate  100 mg Oral BID   multivitamin with minerals  1 tablet Oral Daily   sodium chloride flush  3 mL Intravenous Q12H   thiamine  100 mg Oral Daily   Or   thiamine  100 mg Intravenous Daily   Continuous Infusions:   PRN Meds: acetaminophen **OR** acetaminophen, albuterol, metoprolol tartrate, mouth rinse, polyethylene glycol   Vital Signs    Vitals:   06/29/22 0400 06/29/22 0500 06/29/22 0600 06/29/22 0800  BP: 112/76 122/75 (!) 116/95 119/87  Pulse: (!) 52 (!) 56 69 99  Resp: (!) 25 (!) 27 19 15   Temp: 98.8 F (37.1 C)   99.5 F (37.5 C)  TempSrc: Axillary   Oral  SpO2: 92% 95% 93% 95%  Weight:      Height:        Intake/Output Summary (Last 24 hours) at 06/29/2022 1018 Last data filed at 06/29/2022 0954 Gross per 24 hour  Intake 193 ml  Output 3250 ml  Net -3057 ml      06/28/2022    5:00 AM 06/27/2022    4:38 AM 06/26/2022    5:00 AM  Last 3 Weights  Weight (lbs) 253 lb 8.5 oz 258 lb 2.5 oz 262 lb 5.6 oz  Weight (kg) 115 kg 117.1 kg 119 kg      Telemetry    Atrial fibrillation/flutter with rates ~100-110 bpm- Personally Reviewed  ECG     No new since 10/10- Personally Reviewed  Physical Exam   GEN: Well nourished, well developed in no acute distress HEENT: Normal, moist mucous membranes NECK: No JVD CARDIAC: irregularly irregular rhythm, normal S1  and S2, no rubs or gallops. No murmur. VASCULAR: Radial and DP pulses 2+ bilaterally. No carotid bruits RESPIRATORY:  Clear to auscultation without rales, wheezing or rhonchi  ABDOMEN: Soft, non-tender, non-distended MUSCULOSKELETAL:  Ambulates independently SKIN: Warm and dry, no edema NEUROLOGIC:  Alert and oriented x 3. No focal neuro deficits noted. PSYCHIATRIC:  Normal affect    Labs    High Sensitivity Troponin:   Recent Labs  Lab 06/24/22 1150 06/24/22 1414  TROPONINIHS 2,906* 2,727*     Chemistry Recent Labs  Lab 06/24/22 1150 06/24/22 1506 06/25/22 0516 06/26/22 0603 06/27/22 0722 06/28/22 0510 06/29/22 0444  NA 137  139  --  134* 137 137 139  --   K 3.8  3.8  --  4.5 3.4* 3.8 3.5 4.1  CL 102  102  --  101 103 106 104  --   CO2 26  25  --  25 25 26 28   --   GLUCOSE 96  100*  --  148* 130* 99 119*  --   BUN 7  7  --  19 18 13 12   --  CREATININE 0.94  0.92  --  1.18 1.14 1.01 1.08 1.14  CALCIUM 8.7*  9.0  --  9.1 8.6* 8.6* 9.0  --   MG  --    < > 2.0 2.1 2.2 2.0 2.3  PROT 7.3  --  7.8  --   --   --  7.6  ALBUMIN 3.4*  --  3.4*  --   --   --  3.2*  AST 29  --  24  --   --   --  23  ALT 35  --  31  --   --   --  34  ALKPHOS 67  --  65  --   --   --  62  BILITOT 0.8  --  1.0  --   --   --  0.7  GFRNONAA >60  >60  --  >60 >60 >60 >60 >60  ANIONGAP 9  12  --  8 9 5 7   --    < > = values in this interval not displayed.    Lipids  Recent Labs  Lab 06/24/22 1506  CHOL 113  TRIG 71  HDL 35*  LDLCALC 64  CHOLHDL 3.2    Hematology Recent Labs  Lab 06/24/22 1150 06/25/22 0516 06/26/22 0603  WBC 8.3 9.8 8.6  RBC 4.38 4.32 4.32  HGB 12.7* 12.6* 12.7*  HCT 39.8 39.1 39.5  MCV 90.9 90.5 91.4  MCH 29.0 29.2 29.4  MCHC 31.9 32.2 32.2  RDW 14.4 14.2 14.0  PLT 324 341 314   Thyroid  Recent Labs  Lab 06/24/22 1506 06/24/22 1612 06/26/22 0603  TSH <0.010*  --   --   FREET4  --    < > 1.52*   < > = values in this interval not displayed.     BNP Recent Labs  Lab 06/24/22 1150  BNP 91.0    DDimer No results for input(s): "DDIMER" in the last 168 hours.   Radiology    No results found.  Cardiac Studies   Echocardiogram  1. Left ventricular ejection fraction, by estimation, is 40 to 45%. The  left ventricle has moderately decreased function. The left ventricle  demonstrates global hypokinesis. The left ventricular internal cavity size  was mildly dilated. There is moderate   left ventricular hypertrophy. Left ventricular diastolic parameters are  indeterminate.   2. Right ventricular systolic function is mildly reduced. The right  ventricular size is mildly enlarged. There is normal pulmonary artery  systolic pressure.   3. Right atrial size was mildly dilated.   4. The mitral valve is normal in structure. Mild mitral valve  regurgitation.   5. The aortic valve was not well visualized. Aortic valve regurgitation  is not visualized. No aortic stenosis is present.   6. The inferior vena cava is dilated in size with <50% respiratory  variability, suggesting right atrial pressure of 15 mmHg.   Patient Profile     36 y.o. male with a history of HTN, asthma, obesity, and OSA on CPAP, who was admitted 10/9 w/ CHF, Afib w/ RVR, and hyperthyroidism.  HsTrop 2906.  Assessment & Plan    Atrial flutter with RVR -Likely driven by thyroid disease -rates low 100s when I was in room. Feels well. I expect that his rates will be difficult to manage until the thyroid improves.  -I am ok with him walking. I would expect his heart rates to rise with exertion, but as long as they return  to his current baseline with resting this is fine -Not a good candidate for amiodarone given thyroid disease -Continue Eliquis -could consider switch to propranolol to decrease peripheral conversion of thyroid hormone, but this effect may take several days. As he is feeling much improved, continue metoprolol   Hypothyroidism -Currently on  beta-blocker, methimazole   Alcohol abuse -Cessation recommended   Acute diastolic and systolic CHF Cardiomyopathy, suspect tachycardia-induced -Ejection fraction 40 to 45%, Likely in the setting of atrial flutter with RVR, alcohol abuse -alcohol cessation recommended -admission weight 133.8 kg, current weight as of 10/13 115 kg. Net negative about 10 L, though intake not well documented -appears euvolemic, changing to oral lasix today -Renal function stable -on beta blocker. BP has been variable, will hold on ARB/ARNI/MRA at this time. Consider SGLT2i as outpatient.  For questions or updates, please contact [facility] Please consult [email or web address] for contact info under     Signed, Jodelle Red, MD  06/29/2022, 10:18 AM

## 2022-06-29 NOTE — Progress Notes (Signed)
PROGRESS NOTE    Arthur Barnes  VOH:[id number] DOB: 09-20-1985 DOA: 06/24/2022 PCP: [facility]    Brief Narrative:  Arthur Barnes is a 36 y.o. male with medical history significant of HTN, uncontrolled T2DM and OSA who presents to the ED with c/o leg swelling and SOB x1 week, chest pain x 1 day brief episode.  10/09: On arrival to the ED, patient was hypertensive at 175/135 with heart rate of 155.  He was saturating at 95% on room air prior to being placed on 2 L nasal cannula of supplemental oxygen for work of breathing.  EKG sinus tachycardia up to 155.  CXR cardiomegaly with left pleural effusion, interstitial edema.  Given hypervolemia on examination, he was given one-time dose of Lasix.  S3 gallop, +2 edema to knees on exam.  Admitted for NSTEMI, acute heart failure, due to thyroid storm with undetectable TSH and elevated free T4 at 1.34.  Cardiology and PCCM consulted. 10/10: Changed methimazole to PTU. Significant tachycardia but unlikely to correct until underlying hyperthyroidism improves. Independently, Dr Karna Christmas spoke w/ Dr Tedd Sias [facility], I secure chatted w/ Dr Lucianne Muss of [facility] - both specialists agree unlikely true thyroid storm, cardiac issues seem to be primary concern, unlikely patient will improve until several weeks on medicine. Remains on heparin for NSTEMI and if cardiology clears for discharge he can go on methimazole or PTU, would stop steroids and iodide, would NOT increase calcium channel blocker or beta blocker as this may have minimal effect on HR and will be likely to cause hypotension     10/11 dc ativan. 10/12 no withdrawal. 10/13 eating pretzel today. HR at rest is 120's. Reports no dizziness or cp. Less sob.  10/14 HR 110-122 while standing by bedside     Consultants:  Cardiology PCCM  Procedures:   Antimicrobials:      Subjective: No sob, cp , dizziness    Objective: Vitals:   06/29/22 0300 06/29/22 0400 06/29/22 0500  06/29/22 0600  BP: 130/76 112/76 122/75 (!) 116/95  Pulse: (!) 52 (!) 52 (!) 56 69  Resp: 19 (!) 25 (!) 27 19  Temp:  98.8 F (37.1 C)    TempSrc:  Axillary    SpO2: 91% 92% 95% 93%  Weight:      Height:        Intake/Output Summary (Last 24 hours) at 06/29/2022 0908 Last data filed at 06/28/2022 2232 Gross per 24 hour  Intake 193 ml  Output 2600 ml  Net -2407 ml   Filed Weights   06/26/22 0500 06/27/22 0438 06/28/22 0500  Weight: 119 kg 117.1 kg 115 kg    Examination: Calm, NAD Cta no wheezing Reg mild tach s1/s2 no gallop Soft benign +bs  +LE Edema, improving  Aaoxox3  Mood and affect appropriate in current setting     Data Reviewed: I have personally reviewed following labs and imaging studies  CBC: Recent Labs  Lab 06/24/22 1150 06/25/22 0516 06/26/22 0603  WBC 8.3 9.8 8.6  HGB 12.7* 12.6* 12.7*  HCT 39.8 39.1 39.5  MCV 90.9 90.5 91.4  PLT 324 341 314   Basic Metabolic Panel: Recent Labs  Lab 06/24/22 1150 06/24/22 1506 06/25/22 0516 06/26/22 0603 06/27/22 0722 06/28/22 0510 06/29/22 0444  NA 137  139  --  134* 137 137 139  --   K 3.8  3.8  --  4.5 3.4* 3.8 3.5 4.1  CL 102  102  --  101 103 106 104  --  CO2 26  25  --  25 25 26 28   --   GLUCOSE 96  100*  --  148* 130* 99 119*  --   BUN 7  7  --  19 18 13 12   --   CREATININE 0.94  0.92  --  1.18 1.14 1.01 1.08 1.14  CALCIUM 8.7*  9.0  --  9.1 8.6* 8.6* 9.0  --   MG  --    < > 2.0 2.1 2.2 2.0 2.3  PHOS  --   --  4.4 4.6 3.7 3.6 4.7*   < > = values in this interval not displayed.   GFR: Estimated Creatinine Clearance: 106.8 mL/min (by C-G formula based on SCr of 1.14 mg/dL). Liver Function Tests: Recent Labs  Lab 06/24/22 1150 06/25/22 0516  AST 29 24  ALT 35 31  ALKPHOS 67 65  BILITOT 0.8 1.0  PROT 7.3 7.8  ALBUMIN 3.4* 3.4*   No results for input(s): "LIPASE", "AMYLASE" in the last 168 hours. No results for input(s): "AMMONIA" in the last 168 hours. Coagulation  Profile: Recent Labs  Lab 06/24/22 1548  INR 1.2   Cardiac Enzymes: No results for input(s): "CKTOTAL", "CKMB", "CKMBINDEX", "TROPONINI" in the last 168 hours. BNP (last 3 results) No results for input(s): "PROBNP" in the last 8760 hours. HbA1C: No results for input(s): "HGBA1C" in the last 72 hours.  CBG: Recent Labs  Lab 06/28/22 0740 06/28/22 1200 06/28/22 1657 06/28/22 2111 06/29/22 0758  GLUCAP 102* 103* 79 113* 94   Lipid Profile: No results for input(s): "CHOL", "HDL", "LDLCALC", "TRIG", "CHOLHDL", "LDLDIRECT" in the last 72 hours.  Thyroid Function Tests: No results for input(s): "TSH", "T4TOTAL", "FREET4", "T3FREE", "THYROIDAB" in the last 72 hours.  Anemia Panel: No results for input(s): "VITAMINB12", "FOLATE", "FERRITIN", "TIBC", "IRON", "RETICCTPCT" in the last 72 hours. Sepsis Labs: Recent Labs  Lab 06/24/22 1950 06/25/22 0516 06/26/22 0603  PROCALCITON <0.10 <0.10 <0.10  LATICACIDVEN 1.2  --   --     Recent Results (from the past 240 hour(s))  Resp Panel by RT-PCR (Flu A&B, Covid) Anterior Nasal Swab     Status: None   Collection Time: 06/24/22 12:05 PM   Specimen: Anterior Nasal Swab  Result Value Ref Range Status   SARS Coronavirus 2 by RT PCR NEGATIVE NEGATIVE Final    Comment: (NOTE) SARS-CoV-2 target nucleic acids are NOT DETECTED.  The SARS-CoV-2 RNA is generally detectable in upper respiratory specimens during the acute phase of infection. The lowest concentration of SARS-CoV-2 viral copies this assay can detect is 138 copies/mL. A negative result does not preclude SARS-Cov-2 infection and should not be used as the sole basis for treatment or other patient management decisions. A negative result may occur with  improper specimen collection/handling, submission of specimen other than nasopharyngeal swab, presence of viral mutation(s) within the areas targeted by this assay, and inadequate number of viral copies(<138 copies/mL). A negative  result must be combined with clinical observations, patient history, and epidemiological information. The expected result is Negative.  Fact Sheet for Patients:  EntrepreneurPulse.com.au  Fact Sheet for Healthcare Providers:  IncredibleEmployment.be  This test is no t yet approved or cleared by the [place] FDA and  has been authorized for detection and/or diagnosis of SARS-CoV-2 by FDA under an Emergency Use Authorization (EUA). This EUA will remain  in effect (meaning this test can be used) for the duration of the COVID-19 declaration under Section 564(b)(1) of the Act, 21 U.S.C.section 360bbb-3(b)(1),  unless the authorization is terminated  or revoked sooner.       Influenza A by PCR NEGATIVE NEGATIVE Final   Influenza B by PCR NEGATIVE NEGATIVE Final    Comment: (NOTE) The Xpert Xpress SARS-CoV-2/FLU/RSV plus assay is intended as an aid in the diagnosis of influenza from Nasopharyngeal swab specimens and should not be used as a sole basis for treatment. Nasal washings and aspirates are unacceptable for Xpert Xpress SARS-CoV-2/FLU/RSV testing.  Fact Sheet for Patients: BloggerCourse.com  Fact Sheet for Healthcare Providers: SeriousBroker.it  This test is not yet approved or cleared by the [place] FDA and has been authorized for detection and/or diagnosis of SARS-CoV-2 by FDA under an Emergency Use Authorization (EUA). This EUA will remain in effect (meaning this test can be used) for the duration of the COVID-19 declaration under Section 564(b)(1) of the Act, 21 U.S.C. section 360bbb-3(b)(1), unless the authorization is terminated or revoked.  Performed at [facility], [address] [place], [place] [address]   Culture, blood (Routine X 2) w Reflex to ID Panel     Status: None   Collection Time: 06/24/22  7:37 PM   Specimen: BLOOD  Result Value Ref Range  Status   Specimen Description BLOOD BLOOD LEFT HAND  Final   Special Requests   Final    BOTTLES DRAWN AEROBIC AND ANAEROBIC Blood Culture adequate volume   Culture   Final    NO GROWTH 5 DAYS Performed at [facility], [address] [place], [place] [address]    Report Status 06/29/2022 FINAL  Final  Culture, blood (Routine X 2) w Reflex to ID Panel     Status: None   Collection Time: 06/24/22  7:42 PM   Specimen: BLOOD  Result Value Ref Range Status   Specimen Description BLOOD BLOOD RIGHT HAND  Final   Special Requests   Final    BOTTLES DRAWN AEROBIC AND ANAEROBIC Blood Culture results may not be optimal due to an inadequate volume of blood received in culture bottles   Culture   Final    NO GROWTH 5 DAYS Performed at [facility], [address] [place], [place] [address]    Report Status 06/29/2022 FINAL  Final  MRSA Next Gen by PCR, Nasal     Status: None   Collection Time: 06/25/22  6:00 AM   Specimen: Nasal Mucosa; Nasal Swab  Result Value Ref Range Status   MRSA by PCR Next Gen NOT DETECTED NOT DETECTED Final    Comment: (NOTE) The GeneXpert MRSA Assay (FDA approved for NASAL specimens only), is one component of a comprehensive MRSA colonization surveillance program. It is not intended to diagnose MRSA infection nor to guide or monitor treatment for MRSA infections. Test performance is not FDA approved in patients less than 29 years old. Performed at [facility], [address] [place], [place] [address]          Radiology Studies: No results found.      Scheduled Meds:  apixaban  5 mg Oral BID   Chlorhexidine Gluconate Cloth  6 each Topical Q0600   cholestyramine light  4 g Oral TID   diltiazem  120 mg Oral Daily   folic acid  1 mg Oral Daily   furosemide  40 mg Intravenous Q12H   insulin aspart  0-20 Units Subcutaneous TID WC   methimazole  10 mg Oral Daily   metoprolol succinate  100 mg Oral BID   multivitamin  with minerals  1 tablet Oral Daily   sodium chloride flush  3 mL Intravenous Q12H   thiamine  100 mg Oral Daily   Or   thiamine  100 mg Intravenous Daily   Continuous Infusions:    Assessment & Plan:   Principal Problem:   NSTEMI (non-ST elevated myocardial infarction) (HCC) Active Problems:   Thyroid storm   Acute heart failure (HCC)   Narrow complex tachycardia   Type 2 diabetes mellitus (HCC)   Hypertension   OSA (obstructive sleep apnea)   Asthma   Alcohol use   Atrial flutter (HCC)   Cardiomyopathy (HCC)   Hyperthyroidism   Hypokalemia   Acute pulmonary edema (HCC)   Acute respiratory failure with hypoxia (HCC)   Hyperthyroidism uncertain etiology - ruled out Thyroid storm Patient with severe sinus tachycardia, acute heart failure and NSTEMI.   Undetectable TSH at less than 0.01 and elevated free T4 at 1.34 but minimal elevation makes thyroid storm unlikely.   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  Likely in the setting of a flutter with RVR, alcohol abuse and thyroid issues Continue IV Lasix Needs heart rate control Likely will need repeat echo as outpatient once heart rate and thyroid issues have resolved Lasix switch from IV to 40 mg p.o. daily Hold ARB/ARNI/MRA for now due to low bp at times SGLT2I can be considered as  outpatient   Hypokalemia Replaced and stable   NSTEMI (non-ST elevated myocardial infarction) (HCC) Complicated by hyperthyroid, Hx HTN, DM2, OSA Troponins have peaked at 2900 and are downtrending at this time.   LDL within goal at this time Heparin drip initiated by cardiology  will likely need heart catheterization prior to discharge. Aspirin 81 mg daily Formal echocardiogram pending improvement in tachycardia 10/14 no cp.   Narrow complex tachycardia Aflutter with rvr Patient presenting with narrow complex tachycardia with right bundle branch block with rates persistently in the 150s.   6 mg of adenosine and then 12 mg of adenosine without any improvement in tachycardia.   Diltiazem infusion started and titrated to maximum dose without improvement.   Given results demonstrating thyroid storm, was transitioned to beta-blocker. Metoprolol 5 mg IV injection every 4 hours as needed Metoprolol p.o. 25 mg every 6 hours - endocrine recommends would not increase  10/12 not good candidate for amiodarone due to thyroid dz Wean off diltiazem gtt in setting of CM and increase beta blk. Toprol xl started. 10/13.  On Cardizem ER 120 for better rate control Continue NOAC    Type 2 diabetes mellitus (HCC) SSI, resistant scale 10/12 A1c 6.5   Hypertension Treating cardiac issues as above including antihypertensives Will optimize regimen prior to discharge    OSA (obstructive sleep apnea) CPAP at night while admitted Encouraged improved compliance at home postdischarge   Asthma Albuterol as needed for wheezing   Alcohol use Patient  states he is drinking at least 3-4 alcohol beverages per day 5 days out of the week and then 7-9 beverages per day on the other 2 days.  Although patient is certainly overusing alcohol, no evidence of abuse at this time. Start daily folic acid and thiamine       DVT prophylaxis: Eliquis Code Status:full Family Communication: none at  bedside Disposition Plan:  Status is: Inpatient Remains inpatient appropriate because: iv treatemt heart rate still not controlled        LOS: 5 days   Time spent: 35 min    Lynn Ito, MD [facility] Pager [phone]  If 7PM-7AM, please contact night-coverage 06/29/2022, 9:08 AM

## 2022-06-30 DIAGNOSIS — I4892 Unspecified atrial flutter: Secondary | ICD-10-CM | POA: Diagnosis not present

## 2022-06-30 DIAGNOSIS — I420 Dilated cardiomyopathy: Secondary | ICD-10-CM | POA: Diagnosis not present

## 2022-06-30 DIAGNOSIS — I214 Non-ST elevation (NSTEMI) myocardial infarction: Secondary | ICD-10-CM | POA: Diagnosis not present

## 2022-06-30 LAB — BASIC METABOLIC PANEL
Anion gap: 7 (ref 5–15)
BUN: 15 mg/dL (ref 6–20)
CO2: 27 mmol/L (ref 22–32)
Calcium: 8.9 mg/dL (ref 8.9–10.3)
Chloride: 102 mmol/L (ref 98–111)
Creatinine, Ser: 1.25 mg/dL — ABNORMAL HIGH (ref 0.61–1.24)
GFR, Estimated: >60 mL/min (ref >60)
Glucose, Bld: 99 mg/dL (ref 70–99)
Potassium: 4.4 mmol/L (ref 3.5–5.1)
Sodium: 136 mmol/L (ref 135–145)

## 2022-06-30 LAB — GLUCOSE, CAPILLARY
Glucose-Capillary: 89 mg/dL (ref 70–99)
Glucose-Capillary: 109 mg/dL — ABNORMAL HIGH (ref 70–99)

## 2022-06-30 LAB — PHOSPHORUS: Phosphorus: 4.8 mg/dL — ABNORMAL HIGH (ref 2.5–4.6)

## 2022-06-30 LAB — MAGNESIUM: Magnesium: 2.1 mg/dL (ref 1.7–2.4)

## 2022-06-30 MED ORDER — FUROSEMIDE 40 MG PO TABS: 40.0000 mg | ORAL_TABLET | Freq: Every day | ORAL | 0 refills | Status: AC

## 2022-06-30 MED ORDER — APIXABAN 5 MG PO TABS: 5.0000 mg | ORAL_TABLET | Freq: Two times a day (BID) | ORAL | 0 refills | Status: AC

## 2022-06-30 MED ORDER — METHIMAZOLE 10 MG PO TABS: 10.0000 mg | ORAL_TABLET | Freq: Every day | ORAL | 0 refills | Status: AC

## 2022-06-30 MED ORDER — CHOLESTYRAMINE LIGHT 4 G PO PACK: 4.0000 g | PACK | Freq: Three times a day (TID) | ORAL | 0 refills | Status: AC

## 2022-06-30 MED ORDER — FOLIC ACID 1 MG PO TABS: 1.0000 mg | ORAL_TABLET | Freq: Every day | ORAL | 0 refills | Status: AC

## 2022-06-30 MED ORDER — VITAMIN B-1 100 MG PO TABS: 100.0000 mg | ORAL_TABLET | Freq: Every day | ORAL | 0 refills | Status: AC

## 2022-06-30 MED ORDER — DILTIAZEM HCL ER COATED BEADS 120 MG PO CP24: 120.0000 mg | ORAL_CAPSULE | Freq: Every day | ORAL | 0 refills | Status: AC

## 2022-06-30 MED ORDER — METOPROLOL SUCCINATE ER 100 MG PO TB24: 100.0000 mg | ORAL_TABLET | Freq: Two times a day (BID) | ORAL | 0 refills | Status: AC

## 2022-06-30 NOTE — Plan of Care (Signed)
Patient is discharging home.

## 2022-06-30 NOTE — Progress Notes (Addendum)
[facility] for Electrolyte Monitoring and Replacement   Recent Labs: Potassium (mmol/L)  Date Value  06/30/2022 4.4   Magnesium (mg/dL)  Date Value  06/30/2022 2.1   Calcium (mg/dL)  Date Value  06/30/2022 8.9   Albumin (g/dL)  Date Value  06/29/2022 3.2 (L)   Phosphorus (mg/dL)  Date Value  06/30/2022 4.8 (H)   Sodium (mmol/L)  Date Value  06/30/2022 136    Assessment: 36 year old man admitted with acute biventricular heart failure in the setting of atrial flutter and hyperthyroidism. Pharmacy is asked to review and replace electrolytes  Medications: Lasix 40mg  IV q12h >> 40mg  PO QD  Goal of Therapy:  Potassium 4.0 - 5.1 mmol/L Magnesium 2.0 - 2.4 mg/dL All Other Electrolytes WNL  Plan:  Scr stable 1.1>1.2 with falling UOP 2>0.9>0.2 ml/k/h [Net -9.8L]. Decreased dose of lasix 10/14 since pt admit weight 133.8 kg >current weight as of 10/15 110.7 kg and appears euvolemic.  K 4.1>4.4:   WNL, no further repletion today. Mg 2.3>2.1:  WNL, no further repletion today. Recheck electrolytes in AM  Lorna Dibble ,PharmD Clinical Pharmacist 06/30/2022 8:13 AM

## 2022-06-30 NOTE — Progress Notes (Signed)
Rounding Note    Patient Name: Arthur Barnes Date of Encounter: 06/30/2022  [facility] Cardiologist: Lorine Bears, MD   Subjective   Continues to improve. Able to ambulate, swelling nearly resolved.   Inpatient Medications    Scheduled Meds:  apixaban  5 mg Oral BID   Chlorhexidine Gluconate Cloth  6 each Topical Q0600   cholestyramine light  4 g Oral TID   diltiazem  120 mg Oral Daily   folic acid  1 mg Oral Daily   furosemide  40 mg Oral Daily   insulin aspart  0-20 Units Subcutaneous TID WC   methimazole  10 mg Oral Daily   metoprolol succinate  100 mg Oral BID   multivitamin with minerals  1 tablet Oral Daily   sodium chloride flush  3 mL Intravenous Q12H   thiamine  100 mg Oral Daily   Or   thiamine  100 mg Intravenous Daily   Continuous Infusions:   PRN Meds: acetaminophen **OR** acetaminophen, albuterol, metoprolol tartrate, mouth rinse, polyethylene glycol   Vital Signs    Vitals:   06/30/22 0400 06/30/22 0435 06/30/22 0500 06/30/22 0600  BP: 121/82  108/76 122/74  Pulse: 70  62 73  Resp: (!) 23  (!) 26 10  Temp: 98 F (36.7 C)     TempSrc: Oral     SpO2: 97%  92% 100%  Weight:  110.7 kg    Height:        Intake/Output Summary (Last 24 hours) at 06/30/2022 1105 Last data filed at 06/29/2022 2223 Gross per 24 hour  Intake 3 ml  Output --  Net 3 ml      06/30/2022    4:35 AM 06/28/2022    5:00 AM 06/27/2022    4:38 AM  Last 3 Weights  Weight (lbs) 244 lb 0.8 oz 253 lb 8.5 oz 258 lb 2.5 oz  Weight (kg) 110.7 kg 115 kg 117.1 kg      Telemetry    Atrial fibrillation/flutter with rates ~80-110 bpm- Personally Reviewed  ECG     No new since 10/10- Personally Reviewed  Physical Exam   GEN: Well nourished, well developed in no acute distress HEENT: Normal, moist mucous membranes NECK: No JVD CARDIAC: irregularly irregular rhythm, normal S1 and S2, no rubs or gallops. No murmur. VASCULAR: Radial and DP pulses 2+  bilaterally. No carotid bruits RESPIRATORY:  Clear to auscultation without rales, wheezing or rhonchi  ABDOMEN: Soft, non-tender, non-distended MUSCULOSKELETAL:  Ambulates independently SKIN: Warm and dry, no edema NEUROLOGIC:  Alert and oriented x 3. No focal neuro deficits noted. PSYCHIATRIC:  Normal affect    Labs    High Sensitivity Troponin:   Recent Labs  Lab 06/24/22 1150 06/24/22 1414  TROPONINIHS 2,906* 2,727*     Chemistry Recent Labs  Lab 06/24/22 1150 06/24/22 1506 06/25/22 0516 06/26/22 0603 06/27/22 0722 06/28/22 0510 06/29/22 0444 06/30/22 0603  NA 137  139  --  134*   < > 137 139  --  136  K 3.8  3.8  --  4.5   < > 3.8 3.5 4.1 4.4  CL 102  102  --  101   < > 106 104  --  102  CO2 26  25  --  25   < > 26 28  --  27  GLUCOSE 96  100*  --  148*   < > 99 119*  --  99  BUN 7  7  --  19   < > 13 12  --  15  CREATININE 0.94  0.92  --  1.18   < > 1.01 1.08 1.14 1.25*  CALCIUM 8.7*  9.0  --  9.1   < > 8.6* 9.0  --  8.9  MG  --    < > 2.0   < > 2.2 2.0 2.3 2.1  PROT 7.3  --  7.8  --   --   --  7.6  --   ALBUMIN 3.4*  --  3.4*  --   --   --  3.2*  --   AST 29  --  24  --   --   --  23  --   ALT 35  --  31  --   --   --  34  --   ALKPHOS 67  --  65  --   --   --  62  --   BILITOT 0.8  --  1.0  --   --   --  0.7  --   GFRNONAA >60  >60  --  >60   < > >60 >60 >60 >60  ANIONGAP 9  12  --  8   < > 5 7  --  7   < > = values in this interval not displayed.    Lipids  Recent Labs  Lab 06/24/22 1506  CHOL 113  TRIG 71  HDL 35*  LDLCALC 64  CHOLHDL 3.2    Hematology Recent Labs  Lab 06/24/22 1150 06/25/22 0516 06/26/22 0603  WBC 8.3 9.8 8.6  RBC 4.38 4.32 4.32  HGB 12.7* 12.6* 12.7*  HCT 39.8 39.1 39.5  MCV 90.9 90.5 91.4  MCH 29.0 29.2 29.4  MCHC 31.9 32.2 32.2  RDW 14.4 14.2 14.0  PLT 324 341 314   Thyroid  Recent Labs  Lab 06/24/22 1506 06/24/22 1612 06/26/22 0603  TSH <0.010*  --   --   FREET4  --    < > 1.52*   < > = values in  this interval not displayed.    BNP Recent Labs  Lab 06/24/22 1150  BNP 91.0    DDimer No results for input(s): "DDIMER" in the last 168 hours.   Radiology    No results found.  Cardiac Studies   Echocardiogram  1. Left ventricular ejection fraction, by estimation, is 40 to 45%. The  left ventricle has moderately decreased function. The left ventricle  demonstrates global hypokinesis. The left ventricular internal cavity size  was mildly dilated. There is moderate   left ventricular hypertrophy. Left ventricular diastolic parameters are  indeterminate.   2. Right ventricular systolic function is mildly reduced. The right  ventricular size is mildly enlarged. There is normal pulmonary artery  systolic pressure.   3. Right atrial size was mildly dilated.   4. The mitral valve is normal in structure. Mild mitral valve  regurgitation.   5. The aortic valve was not well visualized. Aortic valve regurgitation  is not visualized. No aortic stenosis is present.   6. The inferior vena cava is dilated in size with <50% respiratory  variability, suggesting right atrial pressure of 15 mmHg.   Patient Profile     36 y.o. male with a history of HTN, asthma, obesity, and OSA on CPAP, who was admitted 10/9 w/ CHF, Afib w/ RVR, and hyperthyroidism.  HsTrop 2906.  Assessment & Plan    Atrial flutter with RVR -Likely driven  by thyroid disease -rates continue to gradually improve. 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 Net negative about 10 L, though intake not well documented -appears euvolemic,slight bump in Cr. Would continue daily oral lasix until follow up, this may be able to be changed to PRN once thyroid/rates are more stable -on beta blocker. BP has been variable, will hold on ARB/ARNI/MRA at this time. Consider SGLT2i as outpatient.  Cardiology will sign off. He has an appt scheduled with Clarisa Kindred on 07/04/22 for follow up.  For questions or updates, please contact [facility] Please consult [email or web address] for contact info under     Signed, Jodelle Red, MD  06/30/2022, 11:05 AM

## 2022-06-30 NOTE — Discharge Summary (Signed)
Arthur Barnes:[id number] DOB: 03-01-1986 DOA: 06/24/2022  PCP: [facility]  Admit date: 06/24/2022 Discharge date: 06/30/2022  Admitted From: home Disposition:  home  Recommendations for Outpatient Follow-up:  Follow up with PCP in 1 week Please obtain BMP/CBC in one week Please follow up endocrine in one eek F/u with cardiology in one week      Discharge Condition:Stable CODE STATUS:full  Diet recommendation: Heart Healthy  Brief/Interim Summary: Per HPI: Arthur Barnes is a 36 y.o. male with medical history significant of HTN, uncontrolled T2DM and OSA who presents to the ED with c/o leg swelling and SOB.    Arthur Barnes states that for approximately 1 week, he has been experiencing worsening dyspnea on exertion and bilateral lower extremity swelling.  Approximately 2 days ago, he began to develop shortness of breath that is exacerbated by bending over and speaking.  Then yesterday, he developed substernal chest pain that self resolved.  Chest pain has not recurred since.  He endorses a longtime history of orthopnea that is relieved by wearing his CPAP; this is unchanged in nature.  He denies any recent sick contacts; he denies any fever, chills, rhinorrhea, congestion, wheezing, nausea, vomiting, diarrhea.  He notes a cough that has been going on for weeks and is unchanged in nature.ED Course:  On arrival to the ED, patient was hypertensive at 175/135 with heart rate of 155.  He was saturating at 95% on room air prior to being placed on 2 L nasal cannula of supplemental oxygen for work of breathing.    EKG showed narrow complex tachycardia, patient received IV adenosine push, second dose was given without improvement.  Cardiology was consulted they reviewed and they felt EKG was consistent with a flutter with 221 AV block with right bundle branch block likely in the setting of uncontrolled hyperthyroidism.  Endocrine Dr. Lynnea Ferrier spoke to Dr. Edd Arbour, PCCM who was consulted  and they felt this was not unlikely true thyroid storm, cardiac issues seem to be primary concern, and likely patient will improve until several weeks of medicine.    Hyperthyroidism uncertain etiology - ruled out Thyroid storm Patient with severe sinus tachycardia, acute heart failure and NSTEMI.   Undetectable TSH at less than 0.01 and elevated free T4 at 1.34 but minimal elevation makes thyroid storm unlikely.   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  Likely in the setting of a flutter with RVR, alcohol abuse and thyroid issues Treated with IV lasix>>>switched to po on discharge HR rate controlled. Likely will need repeat echo as outpatient once heart rate and thyroid issues have resolved Hold ARB/ARNI/MRA for now due to low bp at times SGLT2I can be considered as outpatient     Hypokalemia Replaced and stable   NSTEMI (non-ST elevated myocardial infarction) (HCC) Complicated by hyperthyroid, Hx HTN, DM2, OSA TP elevated Per cardiology -Suspect tachy-mediated but will need eventual ischemic eval.  Defer until euthyroid . Will need f/u with cardiology for further w/u and mx  Narrow complex tachycardia Aflutter with rvr Continue Eliquis, cardizem -continue metoprolol rates continue to gradually improved. Expect his rate control will improve as his thyroid is treated -Not a good candidate for amiodarone given thyroid disease Cardiology cleared patient for discharge     Type 2 diabetes mellitus ([place])  A1c 6.5 F/u with  PCP for further mx   Hypertension Stable.   OSA (obstructive sleep apnea) F/u with pcp.   Asthma Albuterol as needed for wheezing   Alcohol use Counseled pt on alcohol cessation         Discharge Diagnoses:  Principal Problem:   NSTEMI (non-ST elevated myocardial infarction) ([place]) Active Problems:   Thyroid storm   Acute heart failure (HCC)   Narrow complex tachycardia   Type 2 diabetes mellitus (HCC)   Hypertension   OSA (obstructive sleep apnea)   Asthma   Alcohol use   Atrial flutter ([place])   Cardiomyopathy ([place])   Hyperthyroidism   Hypokalemia   Acute pulmonary edema (HCC)   Acute respiratory failure with hypoxia [facility])    Discharge Instructions  Discharge Instructions     Diet - low sodium heart healthy   Complete by: As directed    Discharge instructions   Complete by: As directed    NO ALCOHOL F/u with endocrine, pcp, and cardiology Low sodium diet 2000mg    Increase activity slowly   Complete by: As directed       Allergies as of 06/30/2022   No Known Allergies      Medication List     TAKE these medications    apixaban 5 MG Tabs tablet Commonly known as: ELIQUIS Take 1 tablet (5 mg total) by mouth 2 (two) times daily.   cholestyramine light 4 g packet Commonly known as: PREVALITE Take 1 packet (4 g total) by mouth 3 (three) times daily.   diltiazem 120 MG 24 hr capsule Commonly known as: CARDIZEM CD Take 1 capsule (120 mg total) by mouth daily. Start taking on: [birth year] 01, 1961   folic acid 1 MG tablet Commonly known as: FOLVITE Take 1 tablet (1 mg total) by mouth daily. Start taking on: July 01, 2022   furosemide 40 MG tablet Commonly known as: LASIX Take 1 tablet (40 mg total) by mouth daily. Start taking on: July 01, 2022   methimazole 10 MG tablet Commonly known as: TAPAZOLE Take 1 tablet (10 mg total) by mouth daily. Start taking on: July 01, 2022   metoprolol succinate 100 MG 24 hr tablet Commonly  known as: TOPROL-XL Take 1 tablet (100 mg total) by mouth 2 (two) times daily. Take with or immediately following a meal.   thiamine 100 MG tablet Commonly known as: Vitamin B-1 Take 1 tablet (100 mg total) by mouth daily. Start taking on: July 01, 2022        Follow-up Information     Solum, Betsey Holiday, MD Follow up in 1 week(s).   Specialty: Endocrinology Contact information: [address] [facility] [place] [address] [phone]         Wellington Hampshire, MD Follow up in 1 week(s).   Specialty: Cardiology Contact information: [place] [address] [phone]         [facility] [place] Follow up in 1 week(s).   Contact information: [place] [address] [phone]                No Known Allergies  Consultations:  [facility] DM, cardiology   Procedures/Studies: US THYROID  Result Date: 06/25/2022 CLINICAL DATA:  Thyroid storm EXAM: THYROID ULTRASOUND TECHNIQUE: Ultrasound examination of the thyroid gland and adjacent soft tissues was performed. COMPARISON:  None Available. FINDINGS: Parenchymal Echotexture: Moderately heterogenous Isthmus: 1.1 cm Right lobe: 5.2 x 2.1 x 2.2 cm Left lobe: 4.5 x 2.2 x 2.1 cm _________________________________________________________ Estimated total number of nodules >/= 1 cm: 0 Number of spongiform nodules >/=  2 cm not described below (TR1): 0 Number of mixed cystic and solid nodules >/= 1.5 cm not described below (TR2): 0 _________________________________________________________ Mildly enlarged and heterogeneous thyroid gland, nonspecific for medical thyroid disease. No significant hypervascularity. Negative for nodule or focal abnormality. No regional adenopathy. IMPRESSION: Mildly enlarged heterogeneous thyroid compatible with medical thyroid disease. Negative for nodule. The above is in keeping with the ACR TI-RADS recommendations - J Am Coll Radiol  2017;14:587-595. Electronically Signed   By: Judie Petit.  Shick M.D.   On: 06/25/2022 15:52   ECHOCARDIOGRAM COMPLETE  Result Date: 06/25/2022    ECHOCARDIOGRAM REPORT   Patient Name:   Arthur Barnes Date of Exam: 06/25/2022 Medical Rec #:  [id number]      Height:       66.0 in Accession #:    [id number]     Weight:       261.5 lb Date of Birth:  Apr 26, 1986      BSA:          2.241 m Patient Age:    36 years       BP:           137/117 mmHg Patient Gender: M              HR:           73 bpm. Exam Location:  [facility] Procedure: 2D Echo, Cardiac Doppler, Color Doppler and Intracardiac            Opacification Agent Indications:     NSTEMI I21.4  History:         Patient has no prior history of Echocardiogram examinations.                  Risk Factors:Current Smoker and Hypertension. Asthma.  Sonographer:     Cristela Blue Referring Phys:  [id number] Raymon Mutton DUNN Diagnosing Phys: Yvonne Kendall MD  Sonographer Comments: Suboptimal apical window. IMPRESSIONS  1. Left ventricular ejection fraction, by estimation, is 40 to 45%. The left ventricle has moderately decreased function. The left ventricle demonstrates global hypokinesis. The left ventricular internal cavity size was mildly dilated. There is moderate  left ventricular hypertrophy. Left ventricular diastolic parameters are indeterminate.  2. Right ventricular systolic function is mildly reduced. The right ventricular size is mildly enlarged. There is normal pulmonary artery systolic pressure.  3. Right atrial size was mildly dilated.  4. The mitral valve is normal in structure. Mild mitral valve regurgitation.  5. The aortic valve was not well visualized. Aortic valve regurgitation is not visualized. No aortic stenosis is present.  6. The inferior vena cava is dilated in size with <50% respiratory variability, suggesting right atrial pressure of 15 mmHg. FINDINGS  Left Ventricle: Left ventricular ejection fraction, by estimation, is 40 to 45%. The left ventricle has  moderately decreased function. The left ventricle demonstrates global hypokinesis. Definity contrast agent was given IV to delineate the left ventricular endocardial borders. The left ventricular internal cavity size was mildly dilated. There is moderate left ventricular hypertrophy. Left ventricular diastolic  parameters are indeterminate. Right Ventricle: The right ventricular size is mildly enlarged. No increase in right ventricular wall thickness. Right ventricular systolic function is mildly reduced. There is normal pulmonary artery systolic pressure. The tricuspid regurgitant velocity  is 2.13 m/s, and with an assumed right atrial pressure of 15 mmHg, the estimated right ventricular systolic pressure is 33.1 mmHg. Left Atrium: Left atrial size was normal in size. Right Atrium: Right atrial size was mildly dilated. Pericardium: There is no evidence of pericardial effusion. Mitral Valve: The mitral valve is normal in structure. Mild mitral valve regurgitation. Tricuspid Valve: The tricuspid valve is normal in structure. Tricuspid valve regurgitation is mild. Aortic Valve: The aortic valve was not well visualized. Aortic valve regurgitation is not visualized. No aortic stenosis is present. Aortic valve mean gradient measures 3.0 mmHg. Aortic valve peak gradient measures 4.5 mmHg. Aortic valve area, by VTI measures 2.97 cm. Pulmonic Valve: The pulmonic valve was normal in structure. Pulmonic valve regurgitation is trivial. No evidence of pulmonic stenosis. Aorta: The aortic root is normal in size and structure. Pulmonary Artery: The pulmonary artery is of normal size. Venous: The inferior vena cava is dilated in size with less than 50% respiratory variability, suggesting right atrial pressure of 15 mmHg. IAS/Shunts: The interatrial septum was not well visualized.  LEFT VENTRICLE PLAX 2D LVIDd:         5.80 cm LVIDs:         3.60 cm LV PW:         1.40 cm LV IVS:        1.30 cm LVOT diam:     2.20 cm LV SV:          57 LV SV Index:   25 LVOT Area:     3.80 cm  RIGHT VENTRICLE RV Basal diam:  4.20 cm RV Mid diam:    3.30 cm LEFT ATRIUM             Index        RIGHT ATRIUM           Index LA diam:        3.80 cm 1.70 cm/m   RA Area:     18.10 cm LA Vol (A2C):   46.2 ml 20.62 ml/m  RA Volume:   53.90 ml  24.05 ml/m LA Vol (A4C):   47.1 ml 21.02 ml/m LA Biplane Vol: 47.4 ml 21.15 ml/m  AORTIC VALVE AV Area (Vmax):    2.97 cm AV Area (Vmean):   2.44 cm AV Area (VTI):     2.97 cm AV Vmax:           106.00 cm/s AV Vmean:          80.200 cm/s AV VTI:            0.191 m AV Peak Grad:      4.5 mmHg AV Mean Grad:      3.0 mmHg LVOT Vmax:         82.80 cm/s LVOT Vmean:        51.400 cm/s LVOT VTI:          0.149 m LVOT/AV VTI ratio: 0.78  AORTA Ao Root diam: 3.60 cm MITRAL VALVE                TRICUSPID VALVE MV Area (PHT): 12.04 cm    TR Peak grad:   18.1 mmHg MV Decel Time: 63 msec      TR Vmax:  213.00 cm/s MV E velocity: 72.30 cm/s MV A velocity: 108.00 cm/s  SHUNTS MV E/A ratio:  0.67         Systemic VTI:  0.15 m                             Systemic Diam: 2.20 cm Yvonne Kendall MD Electronically signed by Yvonne Kendall MD Signature Date/Time: 06/25/2022/11:00:10 AM    Final    DG Chest 2 View  Result Date: 06/24/2022 CLINICAL DATA:  Shortness of breath.  Labored breathing. EXAM: CHEST - 2 VIEW COMPARISON:  Chest x-rays dated 07/10/2021 and 12/27/2018. FINDINGS: Cardiomegaly, questionably enlarged compared to the previous exams. There is central pulmonary vascular congestion. No confluence opacity to suggest a consolidating pneumonia. Osseous structures about the chest are unremarkable. IMPRESSION: 1. Cardiomegaly, questionably enlarged compared to the previous exams. Pericardial effusion can not be excluded. 2. Probable CHF/volume overload. Electronically Signed   By: Bary Richard M.D.   On: 06/24/2022 12:28      Subjective: Feels well no shortness of breath or chest pain  Discharge Exam: Vitals:    06/30/22 1100 06/30/22 1200  BP: 104/67 104/79  Pulse: 96   Resp: 14 16  Temp:    SpO2: 97%    Vitals:   06/30/22 0920 06/30/22 1000 06/30/22 1100 06/30/22 1200  BP: 106/69 113/74 104/67 104/79  Pulse: (!) 113 90 96   Resp: Temp: 98.7 F (37.1 C)     TempSrc: Oral     SpO2: 94% 92% 97%   Weight:      Height:        General: Pt is alert, awake, not in acute distress Cardiovascular: RRR, S1/S2 +, no rubs, no gallops Respiratory: CTA bilaterally, no wheezing, no rhonchi Abdominal: Soft, NT, ND, bowel sounds + Extremities: no edema, no cyanosis    The results of significant diagnostics from this hospitalization (including imaging, microbiology, ancillary and laboratory) are listed below for reference.     Microbiology: Recent Results (from the past 240 hour(s))  Resp Panel by RT-PCR (Flu A&B, Covid) Anterior Nasal Swab     Status: None   Collection Time: 06/24/22 12:05 PM   Specimen: Anterior Nasal Swab  Result Value Ref Range Status   SARS Coronavirus 2 by RT PCR NEGATIVE NEGATIVE Final    Comment: (NOTE) SARS-CoV-2 target nucleic acids are NOT DETECTED.  The SARS-CoV-2 RNA is generally detectable in upper respiratory specimens during the acute phase of infection. The lowest concentration of SARS-CoV-2 viral copies this assay can detect is 138 copies/mL. A negative result does not preclude SARS-Cov-2 infection and should not be used as the sole basis for treatment or other patient management decisions. A negative result may occur with  improper specimen collection/handling, submission of specimen other than nasopharyngeal swab, presence of viral mutation(s) within the areas targeted by this assay, and inadequate number of viral copies(<138 copies/mL). A negative result must be combined with clinical observations, patient history, and epidemiological information. The expected result is Negative.  Fact Sheet for Patients:   BloggerCourse.com  Fact Sheet for Healthcare Providers:  SeriousBroker.it  This test is no t yet approved or cleared by the [place] FDA and  has been authorized for detection and/or diagnosis of SARS-CoV-2 by FDA under an Emergency Use Authorization (EUA). This EUA will remain  in effect (meaning this test can be used) for the duration of the COVID-19 declaration  under Section 564(b)(1) of the Act, 21 U.S.C.section 360bbb-3(b)(1), unless the authorization is terminated  or revoked sooner.       Influenza A by PCR NEGATIVE NEGATIVE Final   Influenza B by PCR NEGATIVE NEGATIVE Final    Comment: (NOTE) The Xpert Xpress SARS-CoV-2/FLU/RSV plus assay is intended as an aid in the diagnosis of influenza from Nasopharyngeal swab specimens and should not be used as a sole basis for treatment. Nasal washings and aspirates are unacceptable for Xpert Xpress SARS-CoV-2/FLU/RSV testing.  Fact Sheet for Patients: BloggerCourse.com  Fact Sheet for Healthcare Providers: SeriousBroker.it  This test is not yet approved or cleared by the [place] FDA and has been authorized for detection and/or diagnosis of SARS-CoV-2 by FDA under an Emergency Use Authorization (EUA). This EUA will remain in effect (meaning this test can be used) for the duration of the COVID-19 declaration under Section 564(b)(1) of the Act, 21 U.S.C. section 360bbb-3(b)(1), unless the authorization is terminated or revoked.  Performed at [facility], [address] [place], [place] [address]   Culture, blood (Routine X 2) w Reflex to ID Panel     Status: None   Collection Time: 06/24/22  7:37 PM   Specimen: BLOOD  Result Value Ref Range Status   Specimen Description BLOOD BLOOD LEFT HAND  Final   Special Requests   Final    BOTTLES DRAWN AEROBIC AND ANAEROBIC Blood Culture adequate volume   Culture    Final    NO GROWTH 5 DAYS Performed at [facility], [address] [place], [place] [address]    Report Status 06/29/2022 FINAL  Final  Culture, blood (Routine X 2) w Reflex to ID Panel     Status: None   Collection Time: 06/24/22  7:42 PM   Specimen: BLOOD  Result Value Ref Range Status   Specimen Description BLOOD BLOOD RIGHT HAND  Final   Special Requests   Final    BOTTLES DRAWN AEROBIC AND ANAEROBIC Blood Culture results may not be optimal due to an inadequate volume of blood received in culture bottles   Culture   Final    NO GROWTH 5 DAYS Performed at [facility], [address] [place], [place] [address]    Report Status 06/29/2022 FINAL  Final  MRSA Next Gen by PCR, Nasal     Status: None   Collection Time: 06/25/22  6:00 AM   Specimen: Nasal Mucosa; Nasal Swab  Result Value Ref Range Status   MRSA by PCR Next Gen NOT DETECTED NOT DETECTED Final    Comment: (NOTE) The GeneXpert MRSA Assay (FDA approved for NASAL specimens only), is one component of a comprehensive MRSA colonization surveillance program. It is not intended to diagnose MRSA infection nor to guide or monitor treatment for MRSA infections. Test performance is not FDA approved in patients less than 66 years old. Performed at [facility], [address] [place], [place] [address]      Labs: BNP (last 3 results) Recent Labs    06/24/22 1150  BNP 91.0   Basic Metabolic Panel: Recent Labs  Lab 06/25/22 0516 06/26/22 0603 06/27/22 0722 06/28/22 0510 06/29/22 0444 06/30/22 0603  NA 134* 137 137 139  --  136  K 4.5 3.4* 3.8 3.5 4.1 4.4  CL 101 103 106 104  --  102  CO2 --  27  GLUCOSE 148* 130* 99 119*  --  99  BUN 19 18 13  12  --  15  CREATININE 1.18 1.14 1.01 1.08 1.14 1.25*  CALCIUM 9.1 8.6* 8.6* 9.0  --  8.9  MG 2.0 2.1 2.2 2.0 2.3 2.1  PHOS 4.4 4.6 3.7 3.6 4.7* 4.8*   Liver Function Tests: Recent Labs  Lab 06/24/22 1150 06/25/22 0516  06/29/22 0444  AST 29 24 23   ALT 35 31 34  ALKPHOS 67 65 62  BILITOT 0.8 1.0 0.7  PROT 7.3 7.8 7.6  ALBUMIN 3.4* 3.4* 3.2*   No results for input(s): "LIPASE", "AMYLASE" in the last 168 hours. No results for input(s): "AMMONIA" in the last 168 hours. CBC: Recent Labs  Lab 06/24/22 1150 06/25/22 0516 06/26/22 0603  WBC 8.3 9.8 8.6  HGB 12.7* 12.6* 12.7*  HCT 39.8 39.1 39.5  MCV 90.9 90.5 91.4  PLT 324 341 314   Cardiac Enzymes: No results for input(s): "CKTOTAL", "CKMB", "CKMBINDEX", "TROPONINI" in the last 168 hours. BNP: Invalid input(s): "POCBNP" CBG: Recent Labs  Lab 06/29/22 1141 06/29/22 1619 06/29/22 2111 06/30/22 0737 06/30/22 1145  GLUCAP 108* 121* 87 89 109*   D-Dimer No results for input(s): "DDIMER" in the last 72 hours. Hgb A1c No results for input(s): "HGBA1C" in the last 72 hours. Lipid Profile No results for input(s): "CHOL", "HDL", "LDLCALC", "TRIG", "CHOLHDL", "LDLDIRECT" in the last 72 hours. Thyroid function studies No results for input(s): "TSH", "T4TOTAL", "T3FREE", "THYROIDAB" in the last 72 hours.  Invalid input(s): "FREET3" Anemia work up No results for input(s): "VITAMINB12", "FOLATE", "FERRITIN", "TIBC", "IRON", "RETICCTPCT" in the last 72 hours. Urinalysis    Component Value Date/Time   COLORURINE STRAW (A) 06/24/2022 1950   APPEARANCEUR CLEAR (A) 06/24/2022 1950   LABSPEC 1.006 06/24/2022 1950   PHURINE 6.0 06/24/2022 1950   GLUCOSEU NEGATIVE 06/24/2022 1950   HGBUR NEGATIVE 06/24/2022 1950   BILIRUBINUR NEGATIVE 06/24/2022 1950   KETONESUR NEGATIVE 06/24/2022 1950   PROTEINUR NEGATIVE 06/24/2022 1950   NITRITE NEGATIVE 06/24/2022 1950   LEUKOCYTESUR NEGATIVE 06/24/2022 1950   Sepsis Labs Recent Labs  Lab 06/24/22 1150 06/25/22 0516 06/26/22 0603  WBC 8.3 9.8 8.6   Microbiology Recent Results (from the past 240 hour(s))  Resp Panel by RT-PCR (Flu A&B, Covid) Anterior Nasal Swab     Status: None   Collection Time:  06/24/22 12:05 PM   Specimen: Anterior Nasal Swab  Result Value Ref Range Status   SARS Coronavirus 2 by RT PCR NEGATIVE NEGATIVE Final    Comment: (NOTE) SARS-CoV-2 target nucleic acids are NOT DETECTED.  The SARS-CoV-2 RNA is generally detectable in upper respiratory specimens during the acute phase of infection. The lowest concentration of SARS-CoV-2 viral copies this assay can detect is 138 copies/mL. A negative result does not preclude SARS-Cov-2 infection and should not be used as the sole basis for treatment or other patient management decisions. A negative result may occur with  improper specimen collection/handling, submission of specimen other than nasopharyngeal swab, presence of viral mutation(s) within the areas targeted by this assay, and inadequate number of viral copies(<138 copies/mL). A negative result must be combined with clinical observations, patient history, and epidemiological information. The expected result is Negative.  Fact Sheet for Patients:  BloggerCourse.com  Fact Sheet for Healthcare Providers:  SeriousBroker.it  This test is no t yet approved or cleared by the [place] FDA and  has been authorized for detection and/or diagnosis of SARS-CoV-2 by FDA under an Emergency Use Authorization (EUA). This EUA will remain  in effect (meaning this test can  be used) for the duration of the COVID-19 declaration under Section 564(b)(1) of the Act, 21 U.S.C.section 360bbb-3(b)(1), unless the authorization is terminated  or revoked sooner.       Influenza A by PCR NEGATIVE NEGATIVE Final   Influenza B by PCR NEGATIVE NEGATIVE Final    Comment: (NOTE) The Xpert Xpress SARS-CoV-2/FLU/RSV plus assay is intended as an aid in the diagnosis of influenza from Nasopharyngeal swab specimens and should not be used as a sole basis for treatment. Nasal washings and aspirates are unacceptable for Xpert Xpress  SARS-CoV-2/FLU/RSV testing.  Fact Sheet for Patients: BloggerCourse.com  Fact Sheet for Healthcare Providers: SeriousBroker.it  This test is not yet approved or cleared by the [place] FDA and has been authorized for detection and/or diagnosis of SARS-CoV-2 by FDA under an Emergency Use Authorization (EUA). This EUA will remain in effect (meaning this test can be used) for the duration of the COVID-19 declaration under Section 564(b)(1) of the Act, 21 U.S.C. section 360bbb-3(b)(1), unless the authorization is terminated or revoked.  Performed at [facility], [address] [place], [place] [address]   Culture, blood (Routine X 2) w Reflex to ID Panel     Status: None   Collection Time: 06/24/22  7:37 PM   Specimen: BLOOD  Result Value Ref Range Status   Specimen Description BLOOD BLOOD LEFT HAND  Final   Special Requests   Final    BOTTLES DRAWN AEROBIC AND ANAEROBIC Blood Culture adequate volume   Culture   Final    NO GROWTH 5 DAYS Performed at [facility], [address] [place], [place] [address]    Report Status 06/29/2022 FINAL  Final  Culture, blood (Routine X 2) w Reflex to ID Panel     Status: None   Collection Time: 06/24/22  7:42 PM   Specimen: BLOOD  Result Value Ref Range Status   Specimen Description BLOOD BLOOD RIGHT HAND  Final   Special Requests   Final    BOTTLES DRAWN AEROBIC AND ANAEROBIC Blood Culture results may not be optimal due to an inadequate volume of blood received in culture bottles   Culture   Final    NO GROWTH 5 DAYS Performed at [facility], [address], [place], [place] [address]    Report Status 06/29/2022 FINAL  Final  MRSA Next Gen by PCR, Nasal     Status: None   Collection Time: 06/25/22  6:00 AM   Specimen: Nasal Mucosa; Nasal Swab  Result Value Ref Range Status   MRSA by PCR Next Gen NOT DETECTED NOT DETECTED Final    Comment:  (NOTE) The GeneXpert MRSA Assay (FDA approved for NASAL specimens only), is one component of a comprehensive MRSA colonization surveillance program. It is not intended to diagnose MRSA infection nor to guide or monitor treatment for MRSA infections. Test performance is not FDA approved in patients less than 79 years old. Performed at [facility], [address], [place], [place] [address]      Time coordinating discharge: Over 30 minutes  SIGNED:   Lynn Ito, MD  [facility] 06/30/2022, 4:18 PM Pager   If 7PM-7AM, please contact night-coverage [email or web address] Password [email or web address]

## 2022-06-30 NOTE — Progress Notes (Signed)
Discharge teaching completed with patient who is in stable condition. 

## 2022-07-04 ENCOUNTER — Telehealth: Payer: Self-pay | Admitting: Family

## 2022-07-04 ENCOUNTER — Ambulatory Visit: Payer: Self-pay | Admitting: Family

## 2022-07-04 NOTE — Telephone Encounter (Signed)
Patient did not show for his initial [facility] appointment on 07/04/22. Will attempt to reschedule.

## 2022-07-04 NOTE — Progress Notes (Deleted)
   Patient ID: Arthur Barnes, male    DOB: 1986-01-27, 36 y.o.   MRN: [id number]  HPI  Mr Ricketts is a 36 y/o male with a history of  Echo report from 06/25/22 reviewed and showed an EF of 40-45% along with moderate LVH, mild RAE and mild MR.   Admitted 06/24/22 due to                             Cardiology consult obtained.                                  Discharged after 6 days.   He presents today for his initial visit with a chief complaint of   Review of Systems    Physical Exam    Assessment & Plan:  1: Chronic heart failure with reduced ejection fraction- - NYHA class

## 2023-01-17 ENCOUNTER — Emergency Department: Payer: BLUE CROSS/BLUE SHIELD

## 2023-01-17 ENCOUNTER — Encounter: Payer: Self-pay | Admitting: Emergency Medicine

## 2023-01-17 ENCOUNTER — Emergency Department
Admission: EM | Admit: 2023-01-17 | Discharge: 2023-01-17 | Discharge status: Against Medical Advice | Payer: BLUE CROSS/BLUE SHIELD | Attending: Emergency Medicine | Admitting: Emergency Medicine

## 2023-01-17 DIAGNOSIS — I110 Hypertensive heart disease with heart failure: Secondary | ICD-10-CM | POA: Diagnosis not present

## 2023-01-17 DIAGNOSIS — I509 Heart failure, unspecified: Secondary | ICD-10-CM | POA: Diagnosis not present

## 2023-01-17 DIAGNOSIS — R0789 Other chest pain: Secondary | ICD-10-CM | POA: Insufficient documentation

## 2023-01-17 DIAGNOSIS — Z5321 Procedure and treatment not carried out due to patient leaving prior to being seen by health care provider: Secondary | ICD-10-CM | POA: Insufficient documentation

## 2023-01-17 LAB — TROPONIN I (HIGH SENSITIVITY): Troponin I (High Sensitivity): 50 ng/L — ABNORMAL HIGH (ref <18)

## 2023-01-17 LAB — CBC
HCT: 42.1 % (ref 39.0–52.0)
Hemoglobin: 14.3 g/dL (ref 13.0–17.0)
MCH: 29.7 pg (ref 26.0–34.0)
MCHC: 34 g/dL (ref 30.0–36.0)
MCV: 87.5 fL (ref 80.0–100.0)
Platelets: 306 10*3/uL (ref 150–400)
RBC: 4.81 MIL/uL (ref 4.22–5.81)
RDW: 12.8 % (ref 11.5–15.5)
WBC: 5.9 10*3/uL (ref 4.0–10.5)
nRBC: 0 % (ref 0.0–0.2)

## 2023-01-17 LAB — BASIC METABOLIC PANEL
Anion gap: 8 (ref 5–15)
BUN: 8 mg/dL (ref 6–20)
CO2: 21 mmol/L — ABNORMAL LOW (ref 22–32)
Calcium: 8.9 mg/dL (ref 8.9–10.3)
Chloride: 105 mmol/L (ref 98–111)
Creatinine, Ser: 0.91 mg/dL (ref 0.61–1.24)
GFR, Estimated: >60 mL/min (ref >60)
Glucose, Bld: 97 mg/dL (ref 70–99)
Potassium: 3 mmol/L — ABNORMAL LOW (ref 3.5–5.1)
Sodium: 134 mmol/L — ABNORMAL LOW (ref 135–145)

## 2023-01-17 LAB — BRAIN NATRIURETIC PEPTIDE: B Natriuretic Peptide: 13.8 pg/mL (ref 0.0–100.0)

## 2023-01-17 NOTE — ED Triage Notes (Signed)
First Nurse note: pt BIB ems from home with c/o sudden onset left side chest pain that is worse with movement and palpation.  Pt has hx of CHF, and HTN, and has been non-compliant with meds.  Pt given 324 ASA, 1 spray of ntg, and has one inch of ntg to his right chest.  Pt reports some relief of chest pain with ntg.  Pt A&O x4 w/ems.

## 2023-01-17 NOTE — ED Triage Notes (Signed)
Pt presents ambulatory to triage via POV with complaints of L sided CP that started one hour ago with exacerbation when coughing.  Hx of CHF, HTN,  and NSTEMI. A&Ox4 at this time. Pt received 324mg  ASA, 1 nitro spray, and an inch of nitro paste on the R side of his chest - endorses improvement in sx after the nitro administration. Denies fevers, chills, N/V/D, or SOB.

## 2023-01-17 NOTE — ED Notes (Signed)
This writer attempted to take pt back to room.  Pt states to this RN that he does not want to be seen now, as he has started to feel better since being here.  Pt warned of risks of leaving before being seen by MD.  Pt states his understanding of these risks.  IV removed from pts L hand prior to leaving.

## 2023-01-20 ENCOUNTER — Telehealth: Payer: Self-pay | Admitting: Emergency Medicine

## 2023-01-20 NOTE — Telephone Encounter (Signed)
Called patient due to left emergency department before provider exam to inquire about condition and follow up plans. Says he has not made plans to follow up yet.  Advised that he should call pcp or cardiology to inform of chest pain episode and that lab results are available.  I explained that there were abnormalities, and his physician would advised any needed actions.

## 2023-03-13 ENCOUNTER — Telehealth: Payer: Self-pay | Admitting: Family Medicine

## 2023-03-13 NOTE — Telephone Encounter (Signed)
Pt called because he was told by his friend that they have HIV. His friend told him to ask fro prep which he said is a prevention medication so they can be intimate. He wanted to speak to a nurse + I don't have any available appointments till the third week of July.

## 2023-04-22 IMAGING — CR DG CHEST 2V
2 series · 2 of 2 positions shown · non-contrast
Comparison: 12/27/2018

CLINICAL DATA: Chest pain

EXAM:
CHEST - 2 VIEW

[chest pa]
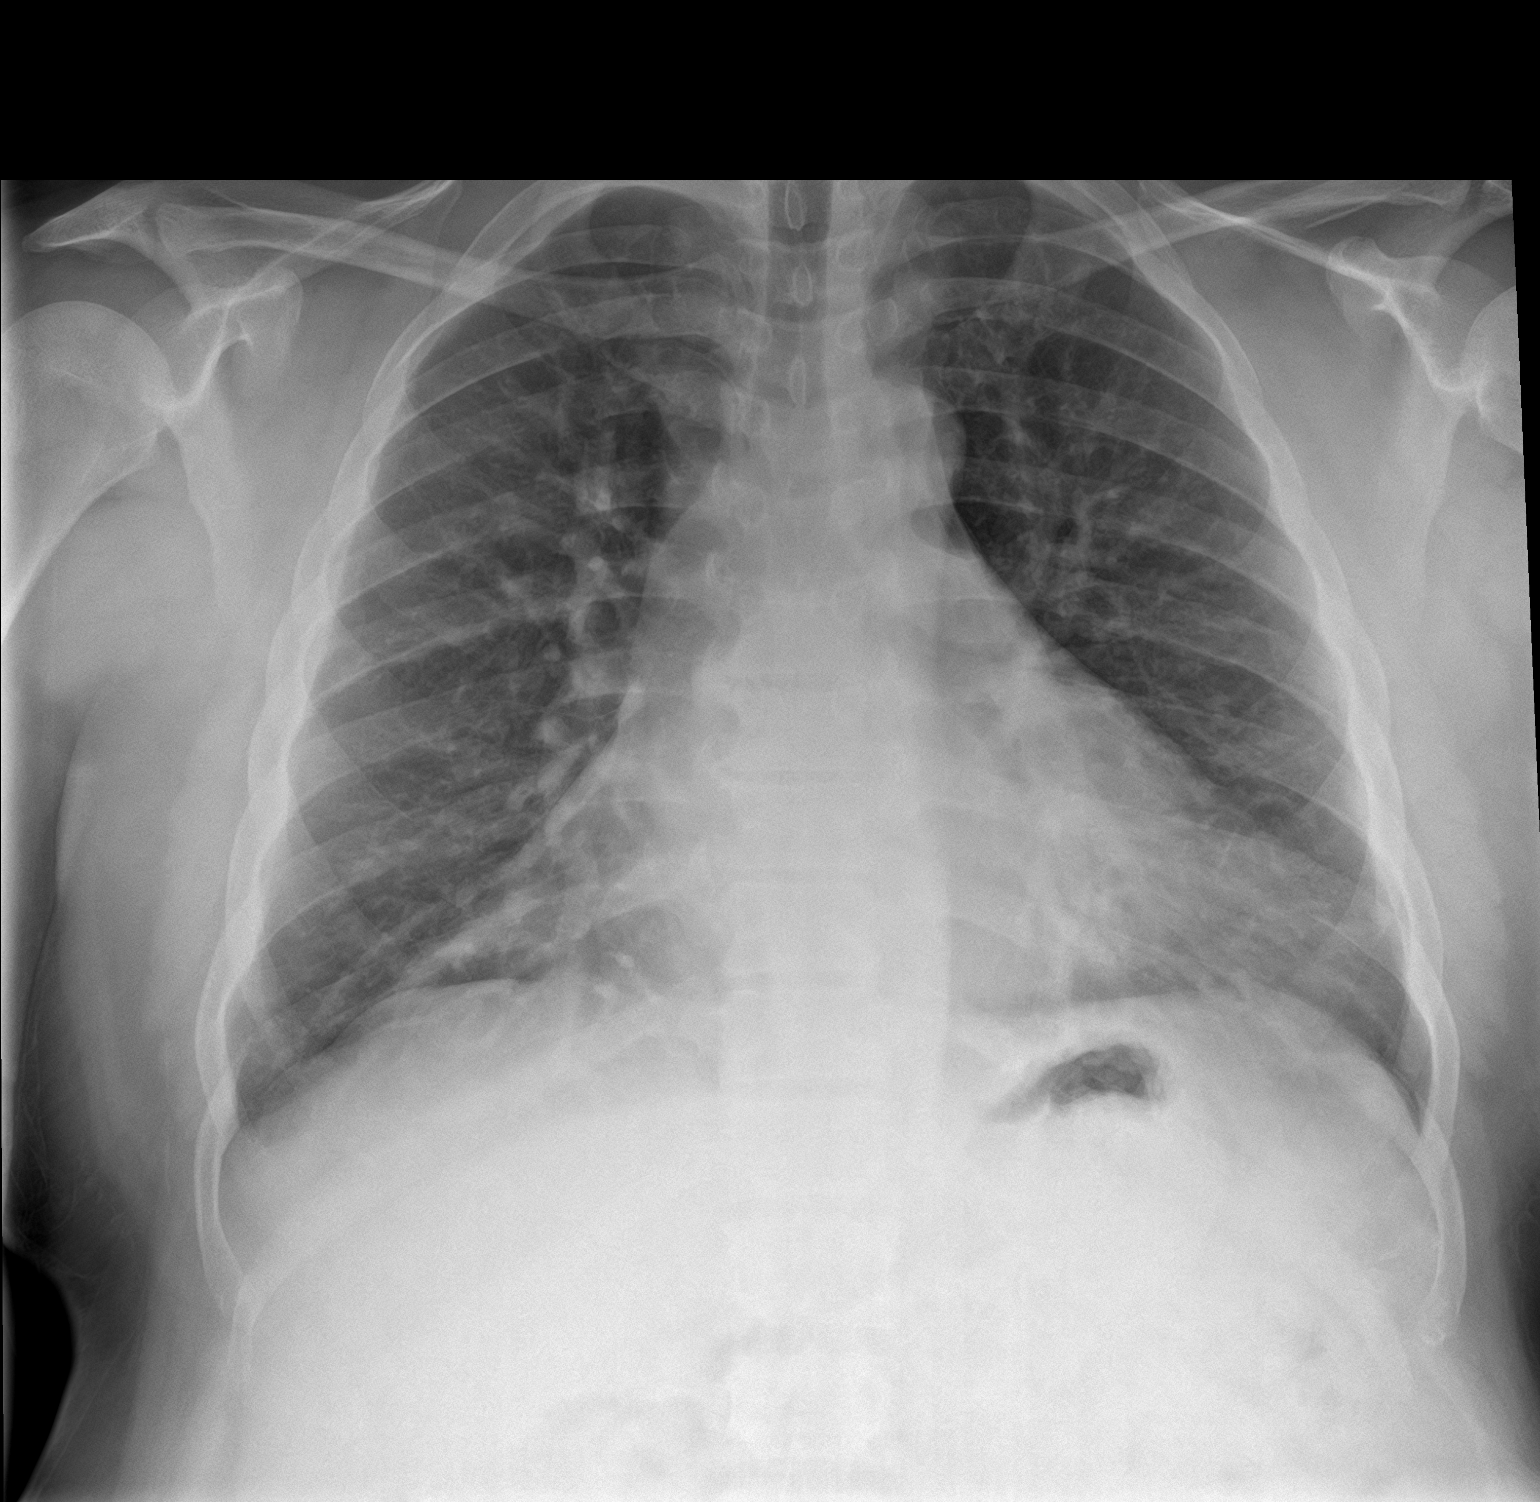

[chest lat]
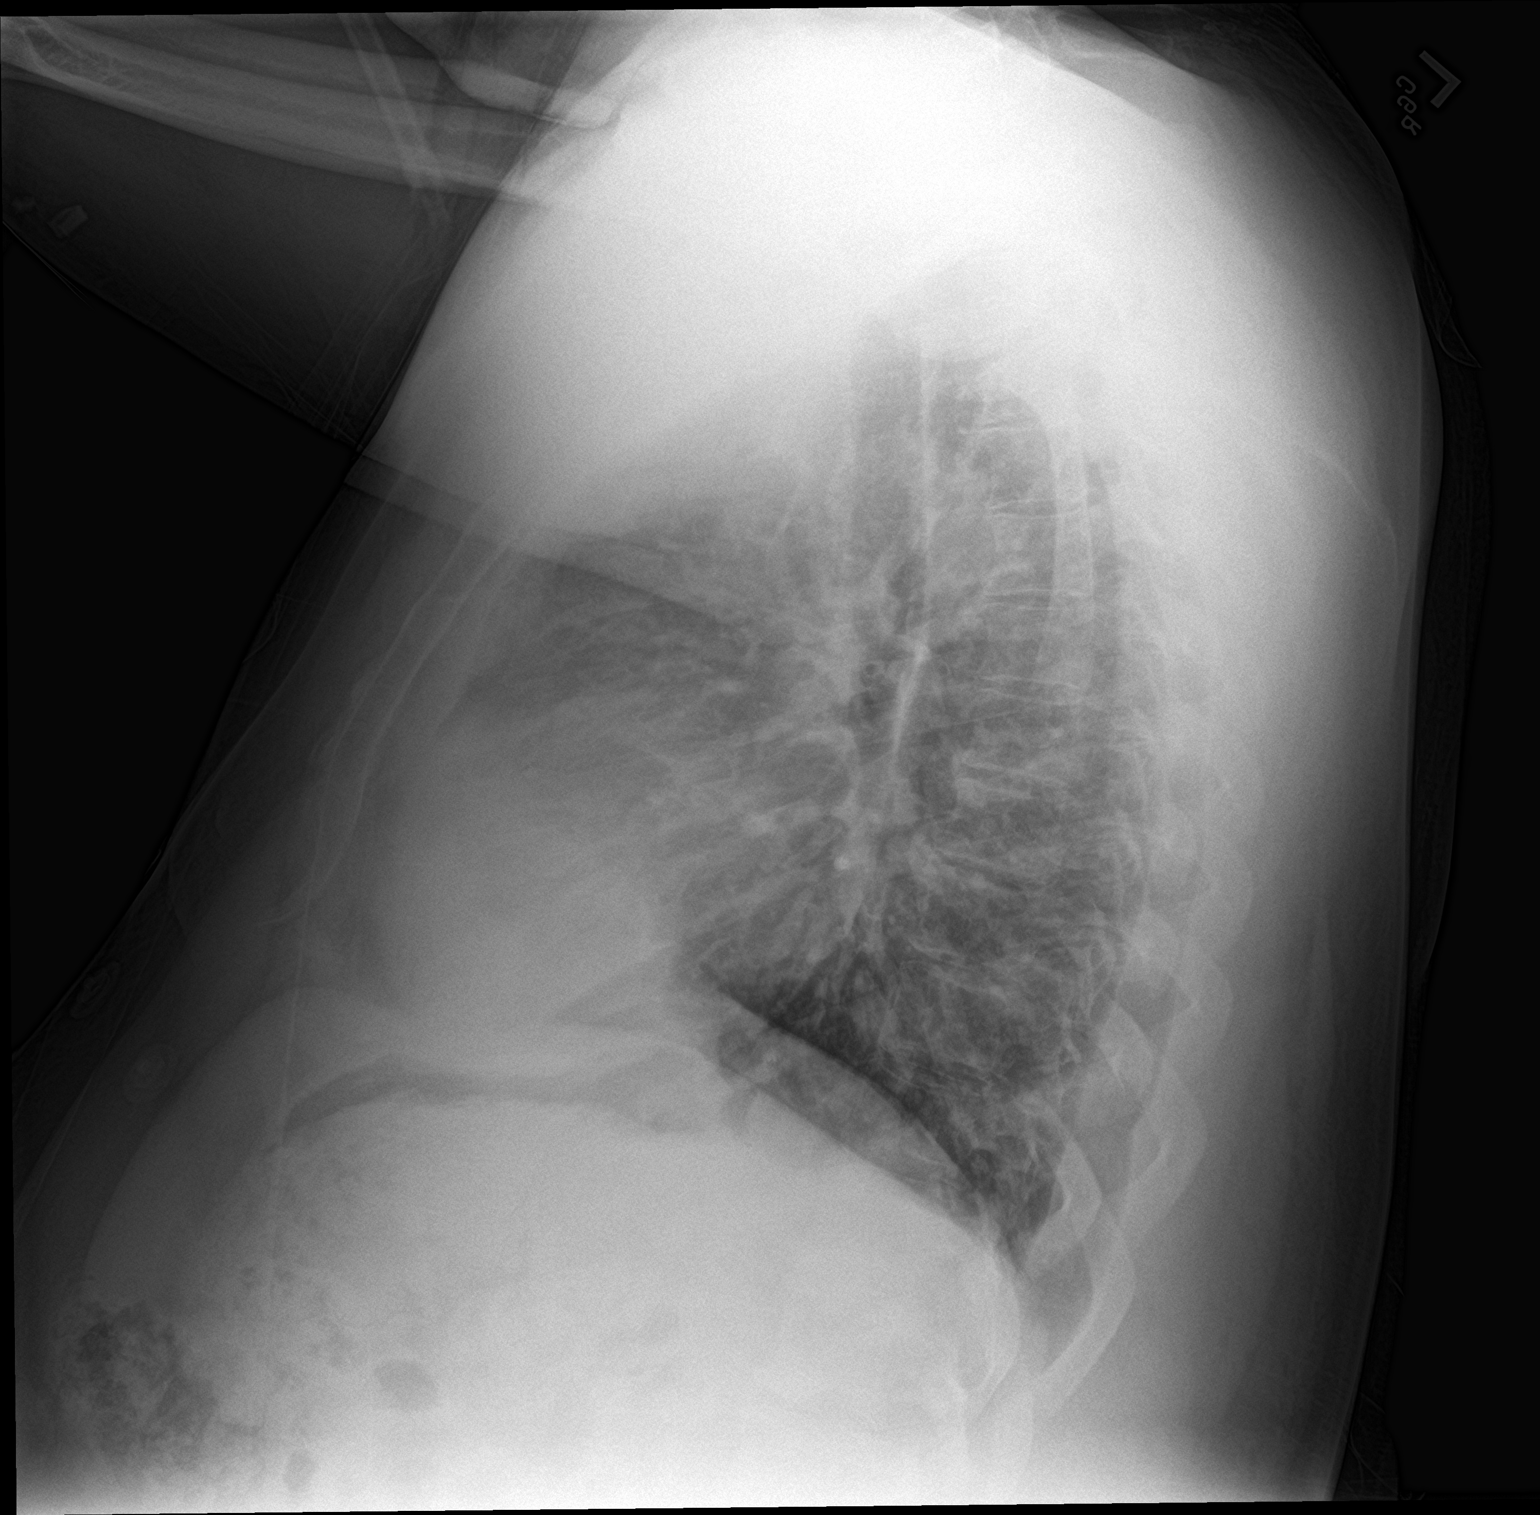

[2 of 2 positions shown; findings below may reference images not displayed]

FINDINGS: Stable mild cardiomegaly. Streaky opacity at the right lung base. No
pleural effusion. No pneumothorax.
IMPRESSION: Streaky opacity at the right lung base may represent atelectasis or
infiltrate.

## 2023-12-16 DEATH — deceased
